# Patient Record
Sex: Male | Born: 1945 | Race: White | Hispanic: No | Marital: Single | Smoking: Former smoker
Health system: Southern US, Community
[De-identification: ages and names within clinical notes are randomized; demographics above are authoritative.]

## PROBLEM LIST (undated history)

## (undated) DIAGNOSIS — E785 Hyperlipidemia, unspecified: Secondary | ICD-10-CM

## (undated) DIAGNOSIS — C801 Malignant (primary) neoplasm, unspecified: Secondary | ICD-10-CM

## (undated) DIAGNOSIS — J449 Chronic obstructive pulmonary disease, unspecified: Secondary | ICD-10-CM

## (undated) DIAGNOSIS — K219 Gastro-esophageal reflux disease without esophagitis: Secondary | ICD-10-CM

## (undated) DIAGNOSIS — F0390 Unspecified dementia without behavioral disturbance: Secondary | ICD-10-CM

## (undated) DIAGNOSIS — F1021 Alcohol dependence, in remission: Secondary | ICD-10-CM

## (undated) DIAGNOSIS — I10 Essential (primary) hypertension: Secondary | ICD-10-CM

## (undated) HISTORY — PX: INSERTION PROSTATE RADIATION SEED: SUR718

## (undated) HISTORY — DX: Chronic obstructive pulmonary disease, unspecified: J44.9

## (undated) HISTORY — DX: Alcohol dependence, in remission: F10.21

## (undated) HISTORY — DX: Essential (primary) hypertension: I10

## (undated) HISTORY — DX: Hyperlipidemia, unspecified: E78.5

## (undated) HISTORY — DX: Unspecified dementia, unspecified severity, without behavioral disturbance, psychotic disturbance, mood disturbance, and anxiety: F03.90

## (undated) HISTORY — DX: Gastro-esophageal reflux disease without esophagitis: K21.9

## (undated) HISTORY — DX: Malignant (primary) neoplasm, unspecified: C80.1

---

## 2019-10-06 LAB — VITAMIN B12: Vitamin B-12: 986

## 2019-10-06 LAB — TSH: TSH: 1.67 (ref <=5.90)

## 2019-11-17 LAB — LIPID PANEL
Cholesterol: 225 — AB (ref 0–200)
HDL: 69 (ref 35–70)
LDL Cholesterol: 138
Triglycerides: 102 (ref 40–160)

## 2019-11-17 LAB — CBC AND DIFFERENTIAL
HCT: 44 (ref 41–53)
Hemoglobin: 15.6 (ref 13.5–17.5)
WBC: 4.9

## 2019-11-17 LAB — CBC: RBC: 4.16 (ref 3.87–5.11)

## 2019-11-23 LAB — BASIC METABOLIC PANEL
BUN: 18 (ref 4–21)
Creatinine: 1.2 (ref <=1.3)
Glucose: 123
Potassium: 4.4 (ref 3.4–5.3)
Sodium: 138 (ref 137–147)

## 2020-04-04 ENCOUNTER — Encounter: Payer: Self-pay | Admitting: *Deleted

## 2020-04-05 ENCOUNTER — Encounter: Payer: Self-pay | Admitting: Diagnostic Neuroimaging

## 2020-04-05 ENCOUNTER — Other Ambulatory Visit: Payer: Self-pay

## 2020-04-05 ENCOUNTER — Ambulatory Visit (INDEPENDENT_AMBULATORY_CARE_PROVIDER_SITE_OTHER): Payer: Medicare HMO | Admitting: Diagnostic Neuroimaging

## 2020-04-05 VITALS — BP 111/66 | HR 54 | Ht 68.0 in | Wt 144.8 lb

## 2020-04-05 DIAGNOSIS — F0390 Unspecified dementia without behavioral disturbance: Secondary | ICD-10-CM | POA: Diagnosis not present

## 2020-04-05 DIAGNOSIS — F03A0 Unspecified dementia, mild, without behavioral disturbance, psychotic disturbance, mood disturbance, and anxiety: Secondary | ICD-10-CM

## 2020-04-05 NOTE — Progress Notes (Signed)
[facility]  PATIENT: Carl Obrien DOB: [birth year]  REFERRING CLINICIAN: Monico Blitz, NP HISTORY FROM: patient  REASON FOR VISIT: new consult    HISTORICAL  CHIEF COMPLAINT:  Chief Complaint  Patient presents with  . Dementia    rm 7 New Pt, brother/HC POA -Bill, sis-in-law- Anne   MMSE 23    HISTORY OF PRESENT ILLNESS:   74 year old male here for evaluation of dementia.  Patient was living independently in [facility], with some history of alcohol abuse.  His brother and sister-in-law are living in [place].  During COVID-19 pandemic they did not have much contact.  However in October 2020 patient did get lost trying to find his way home was found by the police.  In February 2020 when he pressed his emergency alert, was found walking his dog, confused and admitted to the hospital.  He was then diagnosed with dementia and delirium.  Unclear whether he had gone through alcohol withdrawal or some other cause for his delirium.  He was discharged to memory care unit at [facility] place.  He has been there since that time. Patient now on Aricept for dementia.   REVIEW OF SYSTEMS: Full 14 system review of systems performed and negative with exception of: as per HPI.  ALLERGIES: No Known Allergies  HOME MEDICATIONS: Outpatient Medications Prior to Visit  Medication Sig Dispense Refill  . albuterol (VENTOLIN HFA) 108 (90 Base) MCG/ACT inhaler Inhale into the lungs every 6 (six) hours as needed.    Marland Kitchen allopurinol (ZYLOPRIM) 100 MG tablet Take 100 mg by mouth daily.    Marland Kitchen aspirin EC 81 MG tablet Take 81 mg by mouth daily.    Marland Kitchen atorvastatin (LIPITOR) 40 MG tablet Take 40 mg by mouth daily.    . cloNIDine (CATAPRES) 0.1 MG tablet Take 0.1 mg by mouth. Prn hyertension    . donepezil (ARICEPT) 5 MG tablet Take 5 mg by mouth at bedtime.    . hydrochlorothiazide (HYDRODIURIL) 25 MG tablet Take 25 mg by mouth daily.    . Loratadine 10 MG CAPS Take by mouth  daily.    . meloxicam (MOBIC) 7.5 MG tablet Take 7.5 mg by mouth 2 (two) times daily. As needed    . montelukast (SINGULAIR) 10 MG tablet Take 10 mg by mouth as needed.    . pantoprazole (PROTONIX) 40 MG tablet Take 40 mg by mouth daily.    Marland Kitchen thiamine 100 MG tablet Take 100 mg by mouth daily.    . timolol (TIMOPTIC) 0.5 % ophthalmic solution     . zolpidem (AMBIEN) 5 MG tablet Take 5 mg by mouth at bedtime as needed.     No facility-administered medications prior to visit.    PAST MEDICAL HISTORY: Past Medical History:  Diagnosis Date  . Cancer [facility])    prostate, s/p seed implant  . COPD (chronic obstructive pulmonary disease) ([place])   . Dementia ([place])   . GERD (gastroesophageal reflux disease)   . History of alcoholism ([place])   . Hyperlipidemia   . Hypertension     PAST SURGICAL HISTORY: History reviewed. No pertinent surgical history.  FAMILY HISTORY: Family History  Problem Relation Age of Onset  . Liver cancer Mother     SOCIAL HISTORY: Social History   Socioeconomic History  . Marital status: Single    Spouse name: Not on file  . Number of children: 0  . Years of education: Not on file  . Highest education level: Master's degree (e.g.,  MA, MS, MEng, MEd, MSW, MBA)  Occupational History    Comment: retired  Tobacco Use  . Smoking status: Former Research scientist (life sciences)  . Smokeless tobacco: Never Used  . Tobacco comment: years ago  Substance and Sexual Activity  . Alcohol use: Not Currently    Comment: quit Feb 2021  . Drug use: Never  . Sexual activity: Not on file  Other Topics Concern  . Not on file  Social History Narrative   04/04/20 since 01/21/20 lives at [place] , memory care    Social Determinants of Health   Financial Resource Strain:   . Difficulty of Paying Living Expenses:   Food Insecurity:   . Worried About Charity fundraiser in the Last Year:   . Arboriculturist in the Last Year:   Transportation Needs:   . Film/video editor  (Medical):   Marland Kitchen Lack of Transportation (Non-Medical):   Physical Activity:   . Days of Exercise per Week:   . Minutes of Exercise per Session:   Stress:   . Feeling of Stress :   Social Connections:   . Frequency of Communication with Friends and Family:   . Frequency of Social Gatherings with Friends and Family:   . Attends Religious Services:   . Active Member of Clubs or Organizations:   . Attends Archivist Meetings:   Marland Kitchen Marital Status:   Intimate Partner Violence:   . Fear of Current or Ex-Partner:   . Emotionally Abused:   Marland Kitchen Physically Abused:   . Sexually Abused:      PHYSICAL EXAM  GENERAL EXAM/CONSTITUTIONAL: Vitals:  Vitals:   04/05/20 1125  BP: 111/66  Pulse: (!) 54  Weight: 144 lb 12.8 oz (65.7 kg)  Height: 5\' 8"  (1.727 m)     Body mass index is 22.02 kg/m. Wt Readings from Last 3 Encounters:  04/05/20 144 lb 12.8 oz (65.7 kg)     Patient is in no distress; well developed, nourished and groomed; neck is supple  CARDIOVASCULAR:  Examination of carotid arteries is normal; no carotid bruits  Regular rate and rhythm, no murmurs  Examination of peripheral vascular system by observation and palpation is normal  EYES:  Ophthalmoscopic exam of optic discs and posterior segments is normal; no papilledema or hemorrhages  No exam data present  MUSCULOSKELETAL:  Gait, strength, tone, movements noted in Neurologic exam below  NEUROLOGIC: MENTAL STATUS:  MMSE - [place] Exam 04/05/2020  Orientation to time 5  Orientation to Place 2  Registration 3  Attention/ Calculation 3  Recall 2  Language- name 2 objects 2  Language- repeat 1  Language- follow 3 step command 3  Language- read & follow direction 1  Write a sentence 1  Copy design 0  Total score 23    awake, alert, oriented to person, place and time  recent and remote memory intact  normal attention and concentration  language fluent, comprehension intact, naming  intact  fund of knowledge appropriate  CRANIAL NERVE:   2nd - no papilledema on fundoscopic exam  2nd, 3rd, 4th, 6th - pupils equal and reactive to light, visual fields full to confrontation, extraocular muscles intact, no nystagmus  5th - facial sensation symmetric  7th - facial strength symmetric  8th - hearing intact  9th - palate elevates symmetrically, uvula midline  11th - shoulder shrug symmetric  12th - tongue protrusion midline  MOTOR:   normal bulk and tone, full strength in  the BUE, BLE  SENSORY:   normal and symmetric to light touch, temperature, vibration  COORDINATION:   finger-nose-finger, fine finger movements normal  REFLEXES:   deep tendon reflexes present and symmetric  GAIT/STATION:   narrow based gait; USING CANE     DIAGNOSTIC DATA (LABS, IMAGING, TESTING) - I reviewed patient records, labs, notes, testing and imaging myself where available.  No results found for: WBC, HGB, HCT, MCV, PLT No results found for: NA, K, CL, CO2, GLUCOSE, BUN, CREATININE, CALCIUM, PROT, ALBUMIN, AST, ALT, ALKPHOS, BILITOT, GFRNONAA, GFRAA No results found for: CHOL, HDL, LDLCALC, LDLDIRECT, TRIG, CHOLHDL No results found for: HGBA1C No results found for: VITAMINB12 No results found for: TSH    01/03/20 MRI brain - no acute findings - left external capsule chronic lacunar infarct    ASSESSMENT AND PLAN  74 y.o. year old male here with:  Dx:  1. Mild dementia ([place])     PLAN:  MEMORY LOSS / CONFUSION (MMSE 23 out of 30; decline in ADLs not able to manage finances, shopping or driving; not able to live alone; currently living at memory care unit [facility]) - mild-moderate dementia (since 2019) - h/o alcohol abuse (abstinent since March 2021; now at memory care unit) - continue donepezil  Return for return to PCP.    Penni Bombard, MD [id number], [id number] AM Certified in Neurology, Neurophysiology and Neuroimaging  [facility] [address] [place], [place] [address] [phone]

## 2020-04-05 NOTE — Patient Instructions (Signed)
MEMORY LOSS / CONFUSION  - mild-moderate dementia (since 2019) - h/o alcohol abuse (abstinent since March 2021; now at memory care unit) - continue donepezil

## 2020-07-03 ENCOUNTER — Telehealth: Payer: Self-pay | Admitting: *Deleted

## 2020-07-03 NOTE — Telephone Encounter (Signed)
Received fax from [facility] re: surgical clearance. On MD's desk for review, signature of anti-platelet letter.

## 2020-07-05 NOTE — Telephone Encounter (Signed)
Surgical clearance paper and letter signed, faxed to [facility].

## 2020-07-19 ENCOUNTER — Other Ambulatory Visit: Payer: Self-pay

## 2020-07-19 ENCOUNTER — Encounter: Payer: Self-pay | Admitting: Physician Assistant

## 2020-07-19 ENCOUNTER — Ambulatory Visit (INDEPENDENT_AMBULATORY_CARE_PROVIDER_SITE_OTHER): Payer: Medicare HMO | Admitting: Physician Assistant

## 2020-07-19 VITALS — BP 120/70 | HR 63 | Temp 98.1°F | Resp 16 | Ht 67.5 in | Wt 147.0 lb

## 2020-07-19 DIAGNOSIS — Z01818 Encounter for other preprocedural examination: Secondary | ICD-10-CM | POA: Diagnosis not present

## 2020-07-19 LAB — CBC WITH DIFFERENTIAL/PLATELET
Basophils Absolute: 0 10*3/uL (ref 0.0–0.1)
Basophils Relative: 0.2 % (ref 0.0–3.0)
Eosinophils Absolute: 0.1 10*3/uL (ref 0.0–0.7)
Eosinophils Relative: 2.7 % (ref 0.0–5.0)
HCT: 35.7 % — ABNORMAL LOW (ref 39.0–52.0)
Hemoglobin: 12 g/dL — ABNORMAL LOW (ref 13.0–17.0)
Lymphocytes Relative: 27.7 % (ref 12.0–46.0)
Lymphs Abs: 1.4 10*3/uL (ref 0.7–4.0)
MCHC: 33.6 g/dL (ref 30.0–36.0)
MCV: 90.9 fl (ref 78.0–100.0)
Monocytes Absolute: 0.5 10*3/uL (ref 0.1–1.0)
Monocytes Relative: 10.6 % (ref 3.0–12.0)
Neutro Abs: 3.1 10*3/uL (ref 1.4–7.7)
Neutrophils Relative %: 58.8 % (ref 43.0–77.0)
Platelets: 206 10*3/uL (ref 150.0–400.0)
RBC: 3.92 Mil/uL — ABNORMAL LOW (ref 4.22–5.81)
RDW: 15.7 % — ABNORMAL HIGH (ref 11.5–15.5)
WBC: 5.2 10*3/uL (ref 4.0–10.5)

## 2020-07-19 LAB — COMPREHENSIVE METABOLIC PANEL
ALT: 15 U/L (ref 0–53)
AST: 13 U/L (ref 0–37)
Albumin: 4.3 g/dL (ref 3.5–5.2)
Alkaline Phosphatase: 81 U/L (ref 39–117)
BUN: 22 mg/dL (ref 6–23)
CO2: 26 mEq/L (ref 19–32)
Calcium: 9 mg/dL (ref 8.4–10.5)
Chloride: 103 mEq/L (ref 96–112)
Creatinine, Ser: 1.76 mg/dL — ABNORMAL HIGH (ref 0.40–1.50)
GFR: 37.99 mL/min — ABNORMAL LOW (ref >60.00)
Glucose, Bld: 94 mg/dL (ref 70–99)
Potassium: 4.1 mEq/L (ref 3.5–5.1)
Sodium: 138 mEq/L (ref 135–145)
Total Bilirubin: 0.5 mg/dL (ref 0.2–1.2)
Total Protein: 6.1 g/dL (ref 6.0–8.3)

## 2020-07-19 LAB — PROTIME-INR
INR: 1 ratio (ref 0.8–1.0)
Prothrombin Time: 10.8 s (ref 9.6–13.1)

## 2020-07-19 NOTE — Progress Notes (Signed)
Patient presents to clinic today to establish care. Patient is accompanied by his sister-in-law who is one of his caregivers.  Patient recently moved from [facility] where he was living alone to [facility], residing in [place] memory unit after being diagnosed with dementia.  Patient is maintained on Aricept 10 mg daily.  Patient also with history of hypertension hyperlipidemia, currently on a regimen of losartan 50 mg twice daily and hydrochlorothiazide 25 mg once daily.  Is also on a regimen of atorvastatin 40 mg and aspirin 81 mg daily.  Endorses taking all medications as directed.  Denies history of MI or CVA.  Denies any known history of stress testing. Patient denies chest pain, palpitations, lightheadedness, dizziness, vision changes or frequent headaches.  The patient is  having a R hip arthroplasy with Dr. Berenice Primas.  This was initially scheduled for 07/28/2020 but now has been moved to a to be determined date due to Covid.  Is needing surgical clearance.  Has already been granted clearance by his neurologist, Dr. Leta Baptist.  Health Maintenance: Immunizations --records requested Colonoscopy --records requested  Past Medical History:  Diagnosis Date  . Cancer [facility])    prostate, s/p seed implant  . COPD (chronic obstructive pulmonary disease) ([place])   . Dementia ([place])   . GERD (gastroesophageal reflux disease)   . History of alcoholism ([place])   . Hyperlipidemia   . Hypertension    No past surgical history on file.  Current Outpatient Medications on File Prior to Visit  Medication Sig Dispense Refill  . albuterol (VENTOLIN HFA) 108 (90 Base) MCG/ACT inhaler Inhale into the lungs every 6 (six) hours as needed.    Marland Kitchen allopurinol (ZYLOPRIM) 100 MG tablet Take 100 mg by mouth daily.    Marland Kitchen aspirin EC 81 MG tablet Take 81 mg by mouth daily.    Marland Kitchen atorvastatin (LIPITOR) 40 MG tablet Take 40 mg by mouth daily.    . cloNIDine (CATAPRES) 0.1 MG tablet Take 0.1 mg by  mouth. Prn hyertension    . donepezil (ARICEPT) 5 MG tablet Take 5 mg by mouth at bedtime.    . fluticasone (FLONASE) 50 MCG/ACT nasal spray 2 (two) times daily.    . hydrochlorothiazide (HYDRODIURIL) 25 MG tablet Take 25 mg by mouth daily.    Marland Kitchen latanoprost (XALATAN) 0.005 % ophthalmic solution     . Loratadine 10 MG CAPS Take by mouth daily.    Marland Kitchen losartan (COZAAR) 50 MG tablet Take 50 mg by mouth 2 (two) times daily.    . meloxicam (MOBIC) 7.5 MG tablet Take 7.5 mg by mouth 2 (two) times daily. As needed    . montelukast (SINGULAIR) 10 MG tablet Take 10 mg by mouth as needed.    . pantoprazole (PROTONIX) 40 MG tablet Take 40 mg by mouth daily.    Marland Kitchen thiamine 100 MG tablet Take 100 mg by mouth daily.    . vitamin B-12 (CYANOCOBALAMIN) 1000 MCG tablet Take 1,000 mcg by mouth daily.    Marland Kitchen zolpidem (AMBIEN) 5 MG tablet Take 5 mg by mouth at bedtime as needed.     No current facility-administered medications on file prior to visit.    No Known Allergies  Family History  Problem Relation Age of Onset  . Liver cancer Mother     Social History   Socioeconomic History  . Marital status: Single    Spouse name: Not on file  . Number of children: 0  . Years of education:  Not on file  . Highest education level: Master's degree (e.g., MA, MS, MEng, MEd, MSW, MBA)  Occupational History    Comment: retired  Tobacco Use  . Smoking status: Former Research scientist (life sciences)  . Smokeless tobacco: Never Used  . Tobacco comment: years ago  Substance and Sexual Activity  . Alcohol use: Not Currently    Comment: quit Feb 2021  . Drug use: Never  . Sexual activity: Not on file  Other Topics Concern  . Not on file  Social History Narrative   04/04/20 since 01/21/20 lives at [place] , memory care    Social Determinants of Health   Financial Resource Strain:   . Difficulty of Paying Living Expenses: Not on file  Food Insecurity:   . Worried About Charity fundraiser in the Last Year: Not on file    . Ran Out of Food in the Last Year: Not on file  Transportation Needs:   . Lack of Transportation (Medical): Not on file  . Lack of Transportation (Non-Medical): Not on file  Physical Activity:   . Days of Exercise per Week: Not on file  . Minutes of Exercise per Session: Not on file  Stress:   . Feeling of Stress : Not on file  Social Connections:   . Frequency of Communication with Friends and Family: Not on file  . Frequency of Social Gatherings with Friends and Family: Not on file  . Attends Religious Services: Not on file  . Active Member of Clubs or Organizations: Not on file  . Attends Archivist Meetings: Not on file  . Marital Status: Not on file  Intimate Partner Violence:   . Fear of Current or Ex-Partner: Not on file  . Emotionally Abused: Not on file  . Physically Abused: Not on file  . Sexually Abused: Not on file   ROS Pertinent ROS are listed in the HPI.   Ht 5' 7.5" (1.715 m)   Wt 147 lb (66.7 kg)   BMI 22.68 kg/m   Physical Exam Vitals reviewed.  Constitutional:      Appearance: Normal appearance.  HENT:     Head: Normocephalic and atraumatic.     Right Ear: Tympanic membrane normal.     Left Ear: Tympanic membrane normal.     Nose: Nose normal.     Mouth/Throat:     Mouth: Mucous membranes are moist.  Eyes:     Conjunctiva/sclera: Conjunctivae normal.     Pupils: Pupils are equal, round, and reactive to light.  Cardiovascular:     Rate and Rhythm: Normal rate and regular rhythm.     Pulses: Normal pulses.     Heart sounds: Normal heart sounds.  Pulmonary:     Effort: Pulmonary effort is normal.     Breath sounds: Normal breath sounds.  Musculoskeletal:     Cervical back: Neck supple.  Neurological:     General: No focal deficit present.     Mental Status: He is alert. Mental status is at baseline.  Psychiatric:        Mood and Affect: Mood normal.     Assessment/Plan: 1. Preoperative clearance Blood pressure stable today.   Examination unremarkable.  No history of intolerance to anesthesia.  Is already received neurological clearance.  EKG today with mild sinus bradycardia.  No concerning findings.  Will obtain chest x-ray, urine culture and lab panel.  If all looks good will clear for surgery. - Urine Culture - CBC w/Diff -  Comp Met (CMET) - Protime-INR ( [facility] ONLY) - EKG 12-Lead - DG Chest 2 View; Future  This visit occurred during the SARS-CoV-2 public health emergency.  Safety protocols were in place, including screening questions prior to the visit, additional usage of staff PPE, and extensive cleaning of exam room while observing appropriate contact time as indicated for disinfecting solutions.     Leeanne Rio, PA-C

## 2020-07-19 NOTE — Patient Instructions (Signed)
Please go to the lab today for blood work.  I will call you with your results. We will alter treatment regimen(s) if indicated by your results.   Please got to the [place] office at [facility] for x-ray. They are open M-F 8-5  If labs and x-ray look good we can clear you for surgery. I will call you as soon as I have these results.

## 2020-07-20 ENCOUNTER — Ambulatory Visit (INDEPENDENT_AMBULATORY_CARE_PROVIDER_SITE_OTHER)
Admission: RE | Admit: 2020-07-20 | Discharge: 2020-07-20 | Disposition: A | Discharge status: Home / Self Care | Payer: Medicare HMO | Source: Ambulatory Visit | Attending: Physician Assistant | Admitting: Physician Assistant

## 2020-07-20 DIAGNOSIS — Z01818 Encounter for other preprocedural examination: Secondary | ICD-10-CM

## 2020-07-21 LAB — URINE CULTURE
MICRO NUMBER:: 10899494
Result:: NO GROWTH
SPECIMEN QUALITY:: ADEQUATE

## 2020-07-25 ENCOUNTER — Ambulatory Visit: Payer: Medicare HMO | Admitting: Physician Assistant

## 2020-07-28 ENCOUNTER — Encounter: Payer: Self-pay | Admitting: Physician Assistant

## 2020-08-16 ENCOUNTER — Ambulatory Visit (INDEPENDENT_AMBULATORY_CARE_PROVIDER_SITE_OTHER): Payer: Medicare HMO | Admitting: Dermatology

## 2020-08-16 ENCOUNTER — Other Ambulatory Visit: Payer: Self-pay

## 2020-08-16 DIAGNOSIS — L400 Psoriasis vulgaris: Secondary | ICD-10-CM

## 2020-08-16 MED ORDER — TRIAMCINOLONE ACETONIDE 0.1 % EX CREA: 1.0000 "application " | TOPICAL_CREAM | Freq: Every day | CUTANEOUS | 3 refills | Status: DC

## 2020-08-16 NOTE — Patient Instructions (Addendum)
First visit for Zadiel Leyh date of birth 06/07/1946.  For several years he has had a spreading scaly rash which has been called psoriasis and apparently he has been treated with hydrocortisone cream with minor benefit.  He is scheduled to have a hip replaced but denies having visible inflammation in his fingers or hands or severe morning stiffness in these joints.  Examination showed medium plaques relatively thin largest over the lumbosacral and the left front rib cage with smaller involvement of elbows and shin areas.  The nails are normal.  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 I explained the origin of this to Mr. Kanaan in some detail.  He was told there are 3 therapeutic options including multiple topicals, ultraviolet light, and systemic medications.  These help most people but unfortunately do not represent a true cure.  Most people their psoriasis is unrelated to their diet or their soap or anything that they are doing.  This is not contagious.  Initially he will apply 0.1% triamcinolone cream daily immediately after bathing to all areas of involvement.  If there is enough improvement in 4 to 6 weeks, he may choose to cancel the follow-up visit.  Should there be any questions that arise before that time, he or the care providers where he resides may contact us anytime.

## 2020-08-29 ENCOUNTER — Encounter: Payer: Self-pay | Admitting: Emergency Medicine

## 2020-09-08 ENCOUNTER — Other Ambulatory Visit: Payer: Self-pay | Admitting: Orthopedic Surgery

## 2020-09-12 ENCOUNTER — Encounter (HOSPITAL_COMMUNITY): Payer: Self-pay

## 2020-09-12 NOTE — Progress Notes (Addendum)
Preop instructions for:    Carl Obrien                      Date of Birth   Apr 15, 1946                          Date of Procedure:  09/22/2020        Doctor: DR Dorna Leitz  Time to arrive at [facility]:  0930am Report to: Admitting  Procedure:   Right Total Hip Arthroplasty   Ensure preop drink and hibiclens will be brought to [facility] by Loletta Specter ( poa, sister in law)  Do not eat   past midnight the night before your procedure.(To include any tube feedings-must be discontinued) May have clear liquids from 65midnite until 0930am morning of surgery then nothing by mouth.  Please complete Ensure preop drink ordered by surgeon by 0930am morning of surgery.      CLEAR LIQUID DIET   Foods Allowed                                                                      Coffee and tea, regular and decaf                             Plain Jell-O any favor except red or purple                                           Fruit ices (not with fruit pulp)                                      Iced Popsicles                                    Carbonated beverages, regular and diet                                    Cranberry, grape and apple juices Sports drinks like Gatorade Lightly seasoned clear broth or consume(fat free) Sugar, honey syrup                                                 _____________________________________________________________________     CLEAR LIQUID DIET     Coffee and tea, regular and decaf                              Plain Jell-O any favor except red or purple  Fruit ices (not with fruit pulp)                                      Iced Popsicles                                   Carbonated beverages, regular and diet                                    Cranberry, grape and apple juices Sports drinks like Gatorade Lightly seasoned clear broth or consume(fat free) Sugar, honey  syrup                                                  _____________________________________________________________________     Take these morning medications only with sips of water.(or give through gastrostomy or feeding tube).  Inhaler as usual and send with patient, Allopurino ( if takes in am ) l, flonase( if takes in am ) , claritin (  if takes in am )  , pantoprazole ( if takes in am )  ,  eye drops as usual     Facility contact:   [employer]         [phone]:                  [place]: Enlow and Amond Speranza ( brother and sister in law) [phone] Transportation contact phone#:  Please send day of procedure:current med list and meds last taken that day, confirm nothing by mouth status from what time, Patient Demographic info( to include DNR status, problem list, allergies)   RN contact name/phone#:                             and Fax #: [phone]  [place] card and picture ID Leave all jewelry and other valuables at place where living( no metal or rings to be worn) No contact lens  Men-no colognes,lotions, powders, deodorant  Any questions day of procedure,call  SHORT STAY-[phone]   Sent from :[facility]                   [place]                   Fax:[phone]  Sent by : Gillian Shields RN

## 2020-09-12 NOTE — Progress Notes (Signed)
[facility] - Preparing for Surgery Before surgery, you can play an important role.  Because skin is not sterile, your skin needs to be as free of germs as possible.  You can reduce the number of germs on your skin by washing with CHG (chlorahexidine gluconate) soap before surgery.  CHG is an antiseptic cleaner which kills germs and bonds with the skin to continue killing germs even after washing. Please DO NOT use if you have an allergy to CHG or antibacterial soaps.  If your skin becomes reddened/irritated stop using the CHG and inform your nurse when you arrive at Short Stay. Do not shave (including legs and underarms) for at least 48 hours prior to the first CHG shower.  You may shave your face/neck. Please follow these instructions carefully:  1.  Shower with CHG Soap the night before surgery and the  morning of Surgery.  2.  If you choose to wash your hair, wash your hair first as usual with your  normal  shampoo.  3.  After you shampoo, rinse your hair and body thoroughly to remove the  shampoo.                           4.  Use CHG as you would any other liquid soap.  You can apply chg directly  to the skin and wash                       Gently with a scrungie or clean washcloth.  5.  Apply the CHG Soap to your body ONLY FROM THE NECK DOWN.   Do not use on face/ open                           Wound or open sores. Avoid contact with eyes, ears mouth and genitals (private parts).                       Wash face,  Genitals (private parts) with your normal soap.             6.  Wash thoroughly, paying special attention to the area where your surgery  will be performed.  7.  Thoroughly rinse your body with warm water from the neck down.  8.  DO NOT shower/wash with your normal soap after using and rinsing off  the CHG Soap.                9.  Pat yourself dry with a clean towel.            10.  Wear clean pajamas.            11.  Place clean sheets on your bed the night of your first shower and  do not  sleep with pets. Day of Surgery : Do not apply any lotions/deodorants the morning of surgery.  Please wear clean clothes to the hospital/surgery center.  FAILURE TO FOLLOW THESE INSTRUCTIONS MAY RESULT IN THE CANCELLATION OF YOUR SURGERY PATIENT SIGNATURE_________________________________  NURSE SIGNATURE__________________________________  ________________________________________________________________________  

## 2020-09-13 ENCOUNTER — Encounter (HOSPITAL_COMMUNITY)
Admission: RE | Admit: 2020-09-13 | Discharge: 2020-09-13 | Disposition: A | Discharge status: Home / Self Care | Payer: Medicare HMO | Source: Ambulatory Visit | Attending: Physician Assistant | Admitting: Physician Assistant

## 2020-09-18 NOTE — Progress Notes (Signed)
         Anesthesia Review: Has Moderate Dementia  PCP: Resident at [facility]  PCP- Vergia Alcon , [place]  Cardiologist : Chest x-ray :07/21/20  EKG : 07/19/20   Echo : Stress test: Cardiac Cath :  Activity level:  Sleep Study/ CPAP :no Fasting Blood Sugar :      / Checks Blood Sugar -- times a day:   Blood Thinner/ Instructions /Last Dose: ASA / Instructions/ Last Dose :  Labs done at [facility] on 09/14/20 on chart  07/19/20- preop  Clearance Raiford Noble, PAc- epic

## 2020-09-21 MED ORDER — BUPIVACAINE LIPOSOME 1.3 % IJ SUSP
10.0000 mL | Freq: Once | INTRAMUSCULAR | Status: DC
  Filled 2020-09-21: qty 10

## 2020-09-21 NOTE — Progress Notes (Signed)
   New Patient   Subjective  Carl Obrien is a 74 y.o. male who presents for the following: Psoriasis (All over. Started on scalp and now its going down neck, ears, and eyelids. Also on chest. Tx hydrocortisone 2.5% oinment).  Psoriasis Location: scalp,neck,ears, chest, and eyelids. Duration:  Quality:  Associated Signs/Symptoms: scaly rash  Modifying Factors:  Severity:  Timing: Context:    The following portions of the chart were reviewed this encounter and updated as appropriate: Tobacco  Allergies  Meds  Problems  Med Hx  Surg Hx  Fam Hx      Objective  Well appearing patient in no apparent distress; mood and affect are within normal limits.  All skin waist up examined.    First visit for Carl Obrien date of birth 06/07/1946.  For several years he has had a spreading scaly rash which has been called psoriasis and apparently he has been treated with hydrocortisone cream with minor benefit.  He is scheduled to have a hip replaced but denies having visible inflammation in his fingers or hands or severe morning stiffness in these joints.  Examination showed medium plaques relatively thin largest over the lumbosacral and the left front rib cage with smaller involvement of elbows and shin areas.  The nails are normal.  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 I explained the origin of this to Mr. Carl Obrien in some detail.  He was told there are 3 therapeutic options including multiple topicals, ultraviolet light, and systemic medications.  These help most people but unfortunately do not represent a true cure.  Most people their psoriasis is unrelated to their diet or their soap or anything that they are doing.  This is not contagious.  Initially he will apply 0.1% triamcinolone cream daily immediately after bathing to all areas of involvement.  If there is enough improvement in 4 to 6 weeks, he may choose to cancel the follow-up visit.  Should there be any questions  that arise before that time, he or the care providers where he resides may contact us anytime.  Assessment & Plan  Plaque psoriasis (7) Left Elbow - Posterior; Right Elbow - Posterior; Neck - Posterior; Right Hip (side) - Posterior; Left Abdomen (side) - Lower; Left Lower Back; Mid Back  Went over multiple options for treatment, including light treatment, topicals or systemic therapies like biologic drugs. We will start with topical Triamcinolone after bathing and have follow up scheduled.   triamcinolone cream (KENALOG) 0.1 % - Left Abdomen (side) - Lower, Left Elbow - Posterior, Left Lower Back, Mid Back, Neck - Posterior, Right Elbow - Posterior, Right Hip (side) - Posterior

## 2020-09-22 ENCOUNTER — Ambulatory Visit (HOSPITAL_COMMUNITY): Payer: Medicare HMO | Admitting: Physician Assistant

## 2020-09-22 ENCOUNTER — Ambulatory Visit (HOSPITAL_COMMUNITY): Payer: Medicare HMO

## 2020-09-22 ENCOUNTER — Encounter (HOSPITAL_COMMUNITY)
Admission: AD | Disposition: A | Discharge status: Home Health | Payer: Self-pay | Source: Home / Self Care | Attending: Orthopedic Surgery

## 2020-09-22 ENCOUNTER — Other Ambulatory Visit: Payer: Self-pay

## 2020-09-22 ENCOUNTER — Encounter: Payer: Self-pay | Admitting: Dermatology

## 2020-09-22 ENCOUNTER — Encounter (HOSPITAL_COMMUNITY): Payer: Self-pay | Admitting: Orthopedic Surgery

## 2020-09-22 ENCOUNTER — Inpatient Hospital Stay (HOSPITAL_COMMUNITY)
Admission: AD | Admit: 2020-09-22 | Discharge: 2020-09-25 | DRG: 470 | Disposition: A | Discharge status: Home Health | Payer: Medicare HMO | Attending: Orthopedic Surgery | Admitting: Orthopedic Surgery

## 2020-09-22 DIAGNOSIS — M1611 Unilateral primary osteoarthritis, right hip: Principal | ICD-10-CM

## 2020-09-22 DIAGNOSIS — Z881 Allergy status to other antibiotic agents status: Secondary | ICD-10-CM

## 2020-09-22 DIAGNOSIS — S72041A Displaced fracture of base of neck of right femur, initial encounter for closed fracture: Secondary | ICD-10-CM

## 2020-09-22 DIAGNOSIS — Z87891 Personal history of nicotine dependence: Secondary | ICD-10-CM | POA: Diagnosis not present

## 2020-09-22 DIAGNOSIS — Z8546 Personal history of malignant neoplasm of prostate: Secondary | ICD-10-CM | POA: Diagnosis not present

## 2020-09-22 DIAGNOSIS — Z923 Personal history of irradiation: Secondary | ICD-10-CM | POA: Diagnosis not present

## 2020-09-22 DIAGNOSIS — J449 Chronic obstructive pulmonary disease, unspecified: Secondary | ICD-10-CM | POA: Diagnosis present

## 2020-09-22 DIAGNOSIS — Z79899 Other long term (current) drug therapy: Secondary | ICD-10-CM | POA: Diagnosis not present

## 2020-09-22 DIAGNOSIS — F0390 Unspecified dementia without behavioral disturbance: Secondary | ICD-10-CM | POA: Diagnosis present

## 2020-09-22 DIAGNOSIS — Z96641 Presence of right artificial hip joint: Secondary | ICD-10-CM

## 2020-09-22 DIAGNOSIS — K219 Gastro-esophageal reflux disease without esophagitis: Secondary | ICD-10-CM | POA: Diagnosis present

## 2020-09-22 DIAGNOSIS — Z800 Family history of malignant neoplasm of digestive organs: Secondary | ICD-10-CM

## 2020-09-22 DIAGNOSIS — I10 Essential (primary) hypertension: Secondary | ICD-10-CM | POA: Diagnosis present

## 2020-09-22 DIAGNOSIS — Z20822 Contact with and (suspected) exposure to covid-19: Secondary | ICD-10-CM | POA: Diagnosis present

## 2020-09-22 DIAGNOSIS — F1020 Alcohol dependence, uncomplicated: Secondary | ICD-10-CM | POA: Diagnosis present

## 2020-09-22 DIAGNOSIS — D62 Acute posthemorrhagic anemia: Secondary | ICD-10-CM | POA: Diagnosis not present

## 2020-09-22 DIAGNOSIS — Z01811 Encounter for preprocedural respiratory examination: Secondary | ICD-10-CM

## 2020-09-22 HISTORY — PX: TOTAL HIP ARTHROPLASTY: SHX124

## 2020-09-22 LAB — GLUCOSE, CAPILLARY
Glucose-Capillary: 50 mg/dL — ABNORMAL LOW (ref 70–99)
Glucose-Capillary: 130 mg/dL — ABNORMAL HIGH (ref 70–99)
Glucose-Capillary: 81 mg/dL (ref 70–99)

## 2020-09-22 LAB — CBC WITH DIFFERENTIAL/PLATELET
Abs Immature Granulocytes: 0.02 10*3/uL (ref 0.00–0.07)
Basophils Absolute: 0 10*3/uL (ref 0.0–0.1)
Basophils Relative: 0 %
Eosinophils Absolute: 0.2 10*3/uL (ref 0.0–0.5)
Eosinophils Relative: 2 %
HCT: 38.7 % — ABNORMAL LOW (ref 39.0–52.0)
Hemoglobin: 12.7 g/dL — ABNORMAL LOW (ref 13.0–17.0)
Immature Granulocytes: 0 %
Lymphocytes Relative: 21 %
Lymphs Abs: 1.6 10*3/uL (ref 0.7–4.0)
MCH: 30.4 pg (ref 26.0–34.0)
MCHC: 32.8 g/dL (ref 30.0–36.0)
MCV: 92.6 fL (ref 80.0–100.0)
Monocytes Absolute: 1 10*3/uL (ref 0.1–1.0)
Monocytes Relative: 12 %
Neutro Abs: 4.9 10*3/uL (ref 1.7–7.7)
Neutrophils Relative %: 65 %
Platelets: 270 10*3/uL (ref 150–400)
RBC: 4.18 MIL/uL — ABNORMAL LOW (ref 4.22–5.81)
RDW: 14.3 % (ref 11.5–15.5)
WBC: 7.7 10*3/uL (ref 4.0–10.5)
nRBC: 0 % (ref 0.0–0.2)

## 2020-09-22 LAB — COMPREHENSIVE METABOLIC PANEL
ALT: 16 U/L (ref 0–44)
AST: 19 U/L (ref 15–41)
Albumin: 4.1 g/dL (ref 3.5–5.0)
Alkaline Phosphatase: 91 U/L (ref 38–126)
Anion gap: 10 (ref 5–15)
BUN: 19 mg/dL (ref 8–23)
CO2: 25 mmol/L (ref 22–32)
Calcium: 9 mg/dL (ref 8.9–10.3)
Chloride: 103 mmol/L (ref 98–111)
Creatinine, Ser: 1.14 mg/dL (ref 0.61–1.24)
GFR, Estimated: >60 mL/min (ref >60)
Glucose, Bld: 50 mg/dL — ABNORMAL LOW (ref 70–99)
Potassium: 3.3 mmol/L — ABNORMAL LOW (ref 3.5–5.1)
Sodium: 138 mmol/L (ref 135–145)
Total Bilirubin: 0.5 mg/dL (ref 0.3–1.2)
Total Protein: 6.7 g/dL (ref 6.5–8.1)

## 2020-09-22 LAB — PROTIME-INR
INR: 1 (ref 0.8–1.2)
Prothrombin Time: 12.6 seconds (ref 11.4–15.2)

## 2020-09-22 LAB — TYPE AND SCREEN
ABO/RH(D): O POS
Antibody Screen: NEGATIVE

## 2020-09-22 LAB — ABO/RH: ABO/RH(D): O POS

## 2020-09-22 LAB — SURGICAL PCR SCREEN
MRSA, PCR: NEGATIVE
Staphylococcus aureus: NEGATIVE

## 2020-09-22 LAB — RESPIRATORY PANEL BY RT PCR (FLU A&B, COVID)
Influenza A by PCR: NEGATIVE
Influenza B by PCR: NEGATIVE
SARS Coronavirus 2 by RT PCR: NEGATIVE

## 2020-09-22 LAB — APTT: aPTT: 33 seconds (ref 24–36)

## 2020-09-22 SURGERY — ARTHROPLASTY, HIP, TOTAL, ANTERIOR APPROACH
Anesthesia: Spinal | Site: Hip | Laterality: Right

## 2020-09-22 MED ORDER — DEXTROSE 50 % IV SOLN
INTRAVENOUS | Status: AC
  Administered 2020-09-22: 25 g via INTRAVENOUS
  Filled 2020-09-22: qty 50

## 2020-09-22 MED ORDER — METOCLOPRAMIDE HCL 5 MG/ML IJ SOLN: 5.0000 mg | Freq: Three times a day (TID) | INTRAMUSCULAR | Status: DC | PRN

## 2020-09-22 MED ORDER — LACTATED RINGERS IV SOLN: INTRAVENOUS | Status: DC

## 2020-09-22 MED ORDER — PROPOFOL 500 MG/50ML IV EMUL
INTRAVENOUS | Status: DC | PRN
  Administered 2020-09-22: 50 ug/kg/min via INTRAVENOUS

## 2020-09-22 MED ORDER — DEXAMETHASONE SODIUM PHOSPHATE 10 MG/ML IJ SOLN
10.0000 mg | Freq: Two times a day (BID) | INTRAMUSCULAR | Status: AC
  Administered 2020-09-23 – 2020-09-24 (×3): 10 mg via INTRAVENOUS
  Filled 2020-09-22 (×3): qty 1

## 2020-09-22 MED ORDER — FENTANYL CITRATE (PF) 100 MCG/2ML IJ SOLN
INTRAMUSCULAR | Status: AC
  Filled 2020-09-22: qty 2

## 2020-09-22 MED ORDER — ORAL CARE MOUTH RINSE: 15.0000 mL | Freq: Once | OROMUCOSAL | Status: AC

## 2020-09-22 MED ORDER — MENTHOL 3 MG MT LOZG: 1.0000 | LOZENGE | OROMUCOSAL | Status: DC | PRN

## 2020-09-22 MED ORDER — DEXTROSE 50 % IV SOLN: 25.0000 g | INTRAVENOUS | Status: AC

## 2020-09-22 MED ORDER — PHENYLEPHRINE 40 MCG/ML (10ML) SYRINGE FOR IV PUSH (FOR BLOOD PRESSURE SUPPORT)
PREFILLED_SYRINGE | INTRAVENOUS | Status: AC
  Filled 2020-09-22: qty 10

## 2020-09-22 MED ORDER — BUPIVACAINE IN DEXTROSE 0.75-8.25 % IT SOLN
INTRATHECAL | Status: DC | PRN
  Administered 2020-09-22: 1.6 mL via INTRATHECAL

## 2020-09-22 MED ORDER — CHLORHEXIDINE GLUCONATE CLOTH 2 % EX PADS: 6.0000 | MEDICATED_PAD | Freq: Every day | CUTANEOUS | Status: DC

## 2020-09-22 MED ORDER — ONDANSETRON HCL 4 MG/2ML IJ SOLN: 4.0000 mg | Freq: Once | INTRAMUSCULAR | Status: DC | PRN

## 2020-09-22 MED ORDER — ACETAMINOPHEN 325 MG PO TABS
325.0000 mg | ORAL_TABLET | Freq: Four times a day (QID) | ORAL | Status: DC | PRN
  Administered 2020-09-22 – 2020-09-23 (×3): 650 mg via ORAL
  Filled 2020-09-22 (×3): qty 2

## 2020-09-22 MED ORDER — CEFAZOLIN SODIUM-DEXTROSE 2-4 GM/100ML-% IV SOLN
2.0000 g | INTRAVENOUS | Status: AC
  Administered 2020-09-22: 2 g via INTRAVENOUS
  Filled 2020-09-22: qty 100

## 2020-09-22 MED ORDER — SODIUM CHLORIDE 0.9 % IV SOLN: INTRAVENOUS | Status: DC

## 2020-09-22 MED ORDER — METHOCARBAMOL 500 MG IVPB - SIMPLE MED
500.0000 mg | Freq: Four times a day (QID) | INTRAVENOUS | Status: DC | PRN
  Filled 2020-09-22: qty 50

## 2020-09-22 MED ORDER — GLYCOPYRROLATE 0.2 MG/ML IJ SOLN
INTRAMUSCULAR | Status: DC | PRN
  Administered 2020-09-22: .2 mg via INTRAVENOUS

## 2020-09-22 MED ORDER — EPHEDRINE 5 MG/ML INJ
INTRAVENOUS | Status: AC
  Filled 2020-09-22: qty 10

## 2020-09-22 MED ORDER — ATROPINE SULFATE 0.4 MG/ML IJ SOLN
INTRAMUSCULAR | Status: DC | PRN
  Administered 2020-09-22: .2 mg via INTRAVENOUS

## 2020-09-22 MED ORDER — ASPIRIN EC 325 MG PO TBEC
325.0000 mg | DELAYED_RELEASE_TABLET | Freq: Two times a day (BID) | ORAL | Status: DC
  Administered 2020-09-23 – 2020-09-25 (×5): 325 mg via ORAL
  Filled 2020-09-22 (×5): qty 1

## 2020-09-22 MED ORDER — FENTANYL CITRATE (PF) 100 MCG/2ML IJ SOLN
INTRAMUSCULAR | Status: DC | PRN
  Administered 2020-09-22: 50 ug via INTRAVENOUS

## 2020-09-22 MED ORDER — OXYCODONE HCL 5 MG PO TABS: 10.0000 mg | ORAL_TABLET | ORAL | Status: DC | PRN

## 2020-09-22 MED ORDER — DIPHENHYDRAMINE HCL 12.5 MG/5ML PO ELIX: 12.5000 mg | ORAL_SOLUTION | ORAL | Status: DC | PRN

## 2020-09-22 MED ORDER — BISACODYL 5 MG PO TBEC: 5.0000 mg | DELAYED_RELEASE_TABLET | Freq: Every day | ORAL | Status: DC | PRN

## 2020-09-22 MED ORDER — METOCLOPRAMIDE HCL 5 MG PO TABS: 5.0000 mg | ORAL_TABLET | Freq: Three times a day (TID) | ORAL | Status: DC | PRN

## 2020-09-22 MED ORDER — FENTANYL CITRATE (PF) 100 MCG/2ML IJ SOLN: 25.0000 ug | INTRAMUSCULAR | Status: DC | PRN

## 2020-09-22 MED ORDER — CEFAZOLIN SODIUM-DEXTROSE 1-4 GM/50ML-% IV SOLN
1.0000 g | Freq: Once | INTRAVENOUS | Status: AC
  Administered 2020-09-22: 1 g via INTRAVENOUS
  Filled 2020-09-22: qty 50

## 2020-09-22 MED ORDER — POLYETHYLENE GLYCOL 3350 17 G PO PACK: 17.0000 g | PACK | Freq: Every day | ORAL | Status: DC | PRN

## 2020-09-22 MED ORDER — GLYCOPYRROLATE PF 0.2 MG/ML IJ SOSY
PREFILLED_SYRINGE | INTRAMUSCULAR | Status: AC
  Filled 2020-09-22: qty 1

## 2020-09-22 MED ORDER — PHENOL 1.4 % MT LIQD: 1.0000 | OROMUCOSAL | Status: DC | PRN

## 2020-09-22 MED ORDER — EPHEDRINE SULFATE-NACL 50-0.9 MG/10ML-% IV SOSY: PREFILLED_SYRINGE | INTRAVENOUS | Status: DC | PRN

## 2020-09-22 MED ORDER — DEXAMETHASONE SODIUM PHOSPHATE 10 MG/ML IJ SOLN
INTRAMUSCULAR | Status: DC | PRN
  Administered 2020-09-22: 5 mg via INTRAVENOUS

## 2020-09-22 MED ORDER — CHLORHEXIDINE GLUCONATE 0.12 % MT SOLN
15.0000 mL | Freq: Once | OROMUCOSAL | Status: AC
  Administered 2020-09-22: 15 mL via OROMUCOSAL

## 2020-09-22 MED ORDER — OXYCODONE HCL 5 MG PO TABS
5.0000 mg | ORAL_TABLET | ORAL | Status: DC | PRN
  Administered 2020-09-22: 10 mg via ORAL
  Administered 2020-09-22 (×2): 5 mg via ORAL
  Administered 2020-09-23: 10 mg via ORAL
  Administered 2020-09-23 (×3): 5 mg via ORAL
  Administered 2020-09-24 – 2020-09-25 (×3): 10 mg via ORAL
  Filled 2020-09-22: qty 2
  Filled 2020-09-22: qty 1
  Filled 2020-09-22 (×4): qty 2
  Filled 2020-09-22: qty 1
  Filled 2020-09-22 (×3): qty 2

## 2020-09-22 MED ORDER — BUPIVACAINE LIPOSOME 1.3 % IJ SUSP
INTRAMUSCULAR | Status: DC | PRN
  Administered 2020-09-22: 10 mL

## 2020-09-22 MED ORDER — ONDANSETRON HCL 4 MG/2ML IJ SOLN: 4.0000 mg | Freq: Four times a day (QID) | INTRAMUSCULAR | Status: DC | PRN

## 2020-09-22 MED ORDER — MAGNESIUM CITRATE PO SOLN: 1.0000 | Freq: Once | ORAL | Status: DC | PRN

## 2020-09-22 MED ORDER — 0.9 % SODIUM CHLORIDE (POUR BTL) OPTIME
TOPICAL | Status: DC | PRN
  Administered 2020-09-22: 1000 mL

## 2020-09-22 MED ORDER — ONDANSETRON HCL 4 MG PO TABS: 4.0000 mg | ORAL_TABLET | Freq: Four times a day (QID) | ORAL | Status: DC | PRN

## 2020-09-22 MED ORDER — DOCUSATE SODIUM 100 MG PO CAPS
100.0000 mg | ORAL_CAPSULE | Freq: Two times a day (BID) | ORAL | Status: DC
  Administered 2020-09-22 – 2020-09-25 (×5): 100 mg via ORAL
  Filled 2020-09-22 (×5): qty 1

## 2020-09-22 MED ORDER — BUPIVACAINE-EPINEPHRINE 0.5% -1:200000 IJ SOLN
INTRAMUSCULAR | Status: AC
  Filled 2020-09-22: qty 1

## 2020-09-22 MED ORDER — METHOCARBAMOL 500 MG PO TABS
500.0000 mg | ORAL_TABLET | Freq: Four times a day (QID) | ORAL | Status: DC | PRN
  Administered 2020-09-22 – 2020-09-24 (×6): 500 mg via ORAL
  Filled 2020-09-22 (×6): qty 1

## 2020-09-22 MED ORDER — ATROPINE SULFATE 1 MG/10ML IJ SOSY
PREFILLED_SYRINGE | INTRAMUSCULAR | Status: AC
  Filled 2020-09-22: qty 10

## 2020-09-22 MED ORDER — ALUM & MAG HYDROXIDE-SIMETH 200-200-20 MG/5ML PO SUSP: 30.0000 mL | ORAL | Status: DC | PRN

## 2020-09-22 MED ORDER — TRANEXAMIC ACID-NACL 1000-0.7 MG/100ML-% IV SOLN
1000.0000 mg | Freq: Once | INTRAVENOUS | Status: AC
  Administered 2020-09-22: 1000 mg via INTRAVENOUS
  Filled 2020-09-22: qty 100

## 2020-09-22 MED ORDER — HYDROMORPHONE HCL 1 MG/ML IJ SOLN: 0.5000 mg | INTRAMUSCULAR | Status: DC | PRN

## 2020-09-22 MED ORDER — ACETAMINOPHEN 500 MG PO TABS
1000.0000 mg | ORAL_TABLET | Freq: Once | ORAL | Status: AC
  Administered 2020-09-22: 1000 mg via ORAL
  Filled 2020-09-22: qty 2

## 2020-09-22 MED ORDER — EPHEDRINE SULFATE-NACL 50-0.9 MG/10ML-% IV SOSY
PREFILLED_SYRINGE | INTRAVENOUS | Status: DC | PRN
  Administered 2020-09-22: 5 mg via INTRAVENOUS
  Administered 2020-09-22: 10 mg via INTRAVENOUS

## 2020-09-22 MED ORDER — BUPIVACAINE-EPINEPHRINE 0.5% -1:200000 IJ SOLN
INTRAMUSCULAR | Status: DC | PRN
  Administered 2020-09-22: 30 mL

## 2020-09-22 MED ORDER — PROPOFOL 500 MG/50ML IV EMUL
INTRAVENOUS | Status: AC
  Filled 2020-09-22: qty 50

## 2020-09-22 MED ORDER — PHENYLEPHRINE 40 MCG/ML (10ML) SYRINGE FOR IV PUSH (FOR BLOOD PRESSURE SUPPORT)
PREFILLED_SYRINGE | INTRAVENOUS | Status: DC | PRN
  Administered 2020-09-22: 80 ug via INTRAVENOUS
  Administered 2020-09-22: 40 ug via INTRAVENOUS
  Administered 2020-09-22 (×2): 20 ug via INTRAVENOUS
  Administered 2020-09-22 (×2): 40 ug via INTRAVENOUS
  Administered 2020-09-22 (×2): 20 ug via INTRAVENOUS
  Administered 2020-09-22 (×2): 40 ug via INTRAVENOUS

## 2020-09-22 MED ORDER — POVIDONE-IODINE 10 % EX SWAB
2.0000 "application " | Freq: Once | CUTANEOUS | Status: AC
  Administered 2020-09-22: 2 via TOPICAL

## 2020-09-22 MED ORDER — WATER FOR IRRIGATION, STERILE IR SOLN
Status: DC | PRN
  Administered 2020-09-22: 2000 mL

## 2020-09-22 MED ORDER — TRANEXAMIC ACID-NACL 1000-0.7 MG/100ML-% IV SOLN
1000.0000 mg | INTRAVENOUS | Status: AC
  Administered 2020-09-22: 1000 mg via INTRAVENOUS
  Filled 2020-09-22: qty 100

## 2020-09-22 SURGICAL SUPPLY — 41 items
BAG ZIPLOCK 12X15 (MISCELLANEOUS) IMPLANT
BENZOIN TINCTURE PRP APPL 2/3 (GAUZE/BANDAGES/DRESSINGS) ×3 IMPLANT
BLADE SAW SGTL 18X1.27X75 (BLADE) ×2 IMPLANT
BLADE SAW SGTL 18X1.27X75MM (BLADE) ×1
BLADE SURG SZ10 CARB STEEL (BLADE) ×6 IMPLANT
CLOSURE STERI-STRIP 1/2X4 (GAUZE/BANDAGES/DRESSINGS) ×1
CLOSURE WOUND 1/2 X4 (GAUZE/BANDAGES/DRESSINGS)
CLSR STERI-STRIP ANTIMIC 1/2X4 (GAUZE/BANDAGES/DRESSINGS) ×2 IMPLANT
COVER PERINEAL POST (MISCELLANEOUS) ×3 IMPLANT
COVER SURGICAL LIGHT HANDLE (MISCELLANEOUS) ×3 IMPLANT
COVER WAND RF STERILE (DRAPES) IMPLANT
CUP ACETBLR 54 OD 100 SERIES (Hips) ×3 IMPLANT
DECANTER SPIKE VIAL GLASS SM (MISCELLANEOUS) ×3 IMPLANT
DRAPE STERI IOBAN 125X83 (DRAPES) ×3 IMPLANT
DRAPE U-SHAPE 47X51 STRL (DRAPES) ×6 IMPLANT
DRSG AQUACEL AG ADV 3.5X 6 (GAUZE/BANDAGES/DRESSINGS) ×3 IMPLANT
DURAPREP 26ML APPLICATOR (WOUND CARE) ×3 IMPLANT
ELECT BLADE TIP CTD 4 INCH (ELECTRODE) ×3 IMPLANT
ELECT REM PT RETURN 15FT ADLT (MISCELLANEOUS) ×3 IMPLANT
ELIMINATOR HOLE APEX DEPUY (Hips) ×3 IMPLANT
GAUZE XEROFORM 1X8 LF (GAUZE/BANDAGES/DRESSINGS) IMPLANT
GLOVE BIOGEL PI IND STRL 8 (GLOVE) ×2 IMPLANT
GLOVE BIOGEL PI INDICATOR 8 (GLOVE) ×4
GLOVE ECLIPSE 7.5 STRL STRAW (GLOVE) ×6 IMPLANT
GOWN STRL REUS W/TWL XL LVL3 (GOWN DISPOSABLE) ×6 IMPLANT
HEAD CERAMIC 36 PLUS5 (Hips) ×3 IMPLANT
HOLDER FOLEY CATH W/STRAP (MISCELLANEOUS) ×3 IMPLANT
HOOD PEEL AWAY FLYTE STAYCOOL (MISCELLANEOUS) ×6 IMPLANT
KIT TURNOVER KIT A (KITS) IMPLANT
LINER NEUTRAL 36ID 54OD (Liner) ×3 IMPLANT
NEEDLE HYPO 22GX1.5 SAFETY (NEEDLE) ×3 IMPLANT
PACK ANTERIOR HIP CUSTOM (KITS) ×3 IMPLANT
PENCIL SMOKE EVACUATOR (MISCELLANEOUS) IMPLANT
STAPLER VISISTAT 35W (STAPLE) IMPLANT
STEM CORAIL KA12 (Stem) ×3 IMPLANT
STRIP CLOSURE SKIN 1/2X4 (GAUZE/BANDAGES/DRESSINGS) IMPLANT
SUT ETHIBOND NAB CT1 #1 30IN (SUTURE) ×6 IMPLANT
SUT MNCRL AB 3-0 PS2 18 (SUTURE) IMPLANT
SUT VIC AB 0 CT1 36 (SUTURE) ×3 IMPLANT
SUT VIC AB 1 CT1 36 (SUTURE) ×3 IMPLANT
TRAY FOLEY MTR SLVR 16FR STAT (SET/KITS/TRAYS/PACK) ×3 IMPLANT

## 2020-09-22 NOTE — Transfer of Care (Signed)
Immediate Anesthesia Transfer of Care Note  Patient: Kobie Whidby  Procedure(s) Performed: TOTAL HIP ARTHROPLASTY ANTERIOR APPROACH (Right Hip)  Patient Location: PACU  Anesthesia Type:MAC and Spinal  Level of Consciousness: awake, alert  and oriented  Airway & Oxygen Therapy: Patient Spontanous Breathing  Post-op Assessment: Report given to RN and Post -op Vital signs reviewed and stable  Post vital signs: Reviewed and stable  Last Vitals:  Vitals Value Taken Time  BP 113/71 09/22/20 1204  Temp    Pulse 66 09/22/20 1208  Resp 14 09/22/20 1208  SpO2 100 % 09/22/20 1208  Vitals shown include unvalidated device data.  Last Pain:  Vitals:   09/22/20 0804  TempSrc:   PainSc: 0-No pain      Patients Stated Pain Goal: 4 (18/56/31 4970)  Complications: No complications documented.

## 2020-09-22 NOTE — H&P (Signed)
TOTAL HIP ADMISSION H&P  Patient is admitted for right total hip arthroplasty.  Subjective:  Chief Complaint: right hip pain  HPI: Carl Obrien, 74 y.o. male, has a history of pain and functional disability in the right hip(s) due to arthritis and patient has failed non-surgical conservative treatments for greater than 12 weeks to include NSAID's and/or analgesics, use of assistive devices and activity modification.  Onset of symptoms was gradual starting 3 years ago with rapidlly worsening course since that time.The patient noted no past surgery on the right hip(s).  Patient currently rates pain in the right hip at 9 out of 10 with activity. Patient has night pain, worsening of pain with activity and weight bearing, trendelenberg gait, pain that interfers with activities of daily living, pain with passive range of motion and joint swelling. 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 This condition presents safety issues increasing the risk of falls. This patient has had avascular necrosis of the hip, acetabular fracture, hip dysplasia.  There is no current active infection.  There are no problems to display for this patient.  Past Medical History:  Diagnosis Date  . Cancer [facility])    prostate, s/p seed implant  . COPD (chronic obstructive pulmonary disease) ([place])   . Dementia ([place])   . Dementia ([place])   . GERD (gastroesophageal reflux disease)   . History of alcoholism ([place])   . Hyperlipidemia   . Hypertension     Past Surgical History:  Procedure Laterality Date  . INSERTION PROSTATE RADIATION SEED      Current Facility-Administered Medications  Medication Dose Route Frequency Provider Last Rate Last Admin  . bupivacaine liposome (EXPAREL) 1.3 % injection 133 mg  10 mL Other Once Minda Ditto, RPH      . ceFAZolin (ANCEF) IVPB 2g/100 mL premix  2 g Intravenous On Call to OR Dorna Leitz, MD      . lactated  ringers infusion   Intravenous Continuous Catalina Gravel, MD 10 mL/hr at 09/22/20 0815 New Bag at 09/22/20 0815  . tranexamic acid (CYKLOKAPRON) IVPB 1,000 mg  1,000 mg Intravenous To OR Dorna Leitz, MD       Allergies  Allergen Reactions  . Ciprofloxacin Other (See Comments)    unknown    Social History   Tobacco Use  . Smoking status: Former Research scientist (life sciences)  . Smokeless tobacco: Never Used  . Tobacco comment: years ago  Substance Use Topics  . Alcohol use: Not Currently    Comment: quit Feb 2021    Family History  Problem Relation Age of Onset  . Liver cancer Mother      Review of Systems ROS: I have reviewed the patient's review of systems thoroughly and there are no positive responses as relates to the HPI. Objective:  Physical Exam  Vital signs in last 24 hours: Temp:  [98.8 F (37.1 C)] 98.8 F (37.1 C) (11/05 0745) Pulse Rate:  [54] 54 (11/05 0745) Resp:  [18] 18 (11/05 0745) BP: (132)/(76) 132/76 (11/05 0745) SpO2:  [93 %] 93 % (11/05 0745) Weight:  [64.1 kg] 64.1 kg (11/05 0745) Well-developed well-nourished patient in no acute distress. Alert and oriented x3 HEENT:within normal limits Cardiac: Regular rate and rhythm Pulmonary: Lungs clear to auscultation Abdomen: Soft and nontender.  Normal active bowel sounds  Musculoskeletal: R hip : painful rom limited IR NVI distally Labs: Recent Results (from the past 2160 hour(s))  CBC w/Diff  Status: Abnormal   Collection Time: 07/19/20  2:33 PM  Result Value Ref Range   WBC 5.2 4.0 - 10.5 K/uL   RBC 3.92 (L) 4.22 - 5.81 Mil/uL   Hemoglobin 12.0 (L) 13.0 - 17.0 g/dL   HCT 35.7 (L) 39 - 52 %   MCV 90.9 78.0 - 100.0 fl   MCHC 33.6 30.0 - 36.0 g/dL   RDW 15.7 (H) 11.5 - 15.5 %   Platelets 206.0 150 - 400 K/uL   Neutrophils Relative % 58.8 43 - 77 %   Lymphocytes Relative 27.7 12 - 46 %   Monocytes Relative 10.6 3 - 12 %   Eosinophils Relative 2.7 0 - 5 %   Basophils Relative 0.2 0 - 3 %   Neutro Abs 3.1  1.4 - 7.7 K/uL   Lymphs Abs 1.4 0.7 - 4.0 K/uL   Monocytes Absolute 0.5 0.1 - 1.0 K/uL   Eosinophils Absolute 0.1 0.0 - 0.7 K/uL   Basophils Absolute 0.0 0.0 - 0.1 K/uL  Comp Met (CMET)     Status: Abnormal   Collection Time: 07/19/20  2:33 PM  Result Value Ref Range   Sodium 138 135 - 145 mEq/L   Potassium 4.1 3.5 - 5.1 mEq/L   Chloride 103 96 - 112 mEq/L   CO2 26 19 - 32 mEq/L   Glucose, Bld 94 70 - 99 mg/dL   BUN 22 6 - 23 mg/dL   Creatinine, Ser 1.76 (H) 0.40 - 1.50 mg/dL   Total Bilirubin 0.5 0.2 - 1.2 mg/dL   Alkaline Phosphatase 81 39 - 117 U/L   AST 13 0 - 37 U/L   ALT 15 0 - 53 U/L   Total Protein 6.1 6.0 - 8.3 g/dL   Albumin 4.3 3.5 - 5.2 g/dL   GFR 37.99 (L) >60.00 mL/min   Calcium 9.0 8.4 - 10.5 mg/dL  Protime-INR ( [facility] ONLY)     Status: None   Collection Time: 07/19/20  2:33 PM  Result Value Ref Range   INR 1.0 0.8 - 1.0 ratio   Prothrombin Time 10.8 9.6 - 13.1 sec  Urine Culture     Status: None   Collection Time: 07/19/20  3:05 PM   Specimen: Urine  Result Value Ref Range   MICRO NUMBER: [id number]    SPECIMEN QUALITY: Adequate    Sample Source URINE    STATUS: FINAL    Result: No Growth   Respiratory Panel by RT PCR (Flu A&B, Covid) - Nasopharyngeal Swab     Status: None   Collection Time: 09/22/20  7:12 AM   Specimen: Nasopharyngeal Swab  Result Value Ref Range   SARS Coronavirus 2 by RT PCR NEGATIVE NEGATIVE    Comment: (NOTE) SARS-CoV-2 target nucleic acids are NOT DETECTED.  The SARS-CoV-2 RNA is generally detectable in upper respiratoy specimens during the acute phase of infection. The lowest concentration of SARS-CoV-2 viral copies this assay can detect is 131 copies/mL. A negative result does not preclude SARS-Cov-2 infection and should not be used as the sole basis for treatment or other patient management decisions. A negative result may occur with  improper specimen collection/handling, submission of specimen other than nasopharyngeal  swab, presence of viral mutation(s) within the areas targeted by this assay, and inadequate number of viral copies (<131 copies/mL). A negative result must be combined with clinical observations, patient history, and epidemiological information. The expected result is Negative.  Fact Sheet for Patients:  PinkCheek.be  Fact Sheet for Healthcare  Providers:  GravelBags.it  This test is no t yet approved or cleared by the [place] and  has been authorized for detection and/or diagnosis of SARS-CoV-2 by FDA under an Emergency Use Authorization (EUA). This EUA will remain  in effect (meaning this test can be used) for the duration of the COVID-19 declaration under Section 564(b)(1) of the Act, 21 U.S.C. section 360bbb-3(b)(1), unless the authorization is terminated or revoked sooner.     Influenza A by PCR NEGATIVE NEGATIVE   Influenza B by PCR NEGATIVE NEGATIVE    Comment: (NOTE) The Xpert Xpress SARS-CoV-2/FLU/RSV assay is intended as an aid in  the diagnosis of influenza from Nasopharyngeal swab specimens and  should not be used as a sole basis for treatment. Nasal washings and  aspirates are unacceptable for Xpert Xpress SARS-CoV-2/FLU/RSV  testing.  Fact Sheet for Patients: PinkCheek.be  Fact Sheet for Healthcare Providers: GravelBags.it  This test is not yet approved or cleared by the [place] FDA and  has been authorized for detection and/or diagnosis of SARS-CoV-2 by  FDA under an Emergency Use Authorization (EUA). This EUA will remain  in effect (meaning this test can be used) for the duration of the  Covid-19 declaration under Section 564(b)(1) of the Act, 21  U.S.C. section 360bbb-3(b)(1), unless the authorization is  terminated or revoked. Performed at [facility], [place] [address], [place], [place] [address]   APTT      Status: None   Collection Time: 09/22/20  8:00 AM  Result Value Ref Range   aPTT 33 24 - 36 seconds    Comment: Performed at [facility], [place] [address] [place], [place] [address]  CBC WITH DIFFERENTIAL     Status: Abnormal   Collection Time: 09/22/20  8:00 AM  Result Value Ref Range   WBC 7.7 4.0 - 10.5 K/uL   RBC 4.18 (L) 4.22 - 5.81 MIL/uL   Hemoglobin 12.7 (L) 13.0 - 17.0 g/dL   HCT 38.7 (L) 39 - 52 %   MCV 92.6 80.0 - 100.0 fL   MCH 30.4 26.0 - 34.0 pg   MCHC 32.8 30.0 - 36.0 g/dL   RDW 14.3 11.5 - 15.5 %   Platelets 270 150 - 400 K/uL   nRBC 0.0 0.0 - 0.2 %   Neutrophils Relative % 65 %   Neutro Abs 4.9 1.7 - 7.7 K/uL   Lymphocytes Relative 21 %   Lymphs Abs 1.6 0.7 - 4.0 K/uL   Monocytes Relative 12 %   Monocytes Absolute 1.0 0.1 - 1.0 K/uL   Eosinophils Relative 2 %   Eosinophils Absolute 0.2 0.0 - 0.5 K/uL   Basophils Relative 0 %   Basophils Absolute 0.0 0.0 - 0.1 K/uL   Immature Granulocytes 0 %   Abs Immature Granulocytes 0.02 0.00 - 0.07 K/uL    Comment: Performed at [facility], [place] [address] [place], [place] [address]  Comprehensive metabolic panel     Status: Abnormal   Collection Time: 09/22/20  8:00 AM  Result Value Ref Range   Sodium 138 135 - 145 mmol/L   Potassium 3.3 (L) 3.5 - 5.1 mmol/L   Chloride 103 98 - 111 mmol/L   CO2 25 22 - 32 mmol/L   Glucose, Bld 50 (L) 70 - 99 mg/dL    Comment: Glucose reference range applies only to samples taken after fasting for at least 8 hours.   BUN 19 8 - 23 mg/dL  Creatinine, Ser 1.14 0.61 - 1.24 mg/dL   Calcium 9.0 8.9 - 10.3 mg/dL   Total Protein 6.7 6.5 - 8.1 g/dL   Albumin 4.1 3.5 - 5.0 g/dL   AST 19 15 - 41 U/L   ALT 16 0 - 44 U/L   Alkaline Phosphatase 91 38 - 126 U/L   Total Bilirubin 0.5 0.3 - 1.2 mg/dL   GFR, Estimated >60 >60 mL/min    Comment: (NOTE) Calculated using the CKD-EPI Creatinine Equation (2021)    Anion gap 10 5 - 15    Comment: Performed at  [facility], [place] [address], [place], [place] [address]  Protime-INR     Status: None   Collection Time: 09/22/20  8:00 AM  Result Value Ref Range   Prothrombin Time 12.6 11.4 - 15.2 seconds   INR 1.0 0.8 - 1.2    Comment: (NOTE) INR goal varies based on device and disease states. Performed at [facility], [place] [address] [place], [place] [address]   Type and screen Order type and screen if day of surgery is less than 15 days from draw of preadmission visit or order morning of surgery if day of surgery is greater than 6 days from preadmission visit.     Status: None   Collection Time: 09/22/20  8:00 AM  Result Value Ref Range   ABO/RH(D) O POS    Antibody Screen NEG    Sample Expiration      09/25/2020,2359 Performed at [facility], [place] [address] [place], [place] [address]   ABO/Rh     Status: None   Collection Time: 09/22/20  8:32 AM  Result Value Ref Range   ABO/RH(D)      O POS Performed at [facility], [place] [address], [place], [place] [address]   Glucose, capillary     Status: Abnormal   Collection Time: 09/22/20  9:11 AM  Result Value Ref Range   Glucose-Capillary 50 (L) 70 - 99 mg/dL    Comment: Glucose reference range applies only to samples taken after fasting for at least 8 hours.    Estimated body mass index is 21.5 kg/m as calculated from the following:   Height as of this encounter: _0  (1.727 m).   Weight as of this encounter: 64.1 kg.   Imaging Review Plain radiographs demonstrate severe degenerative joint disease of the right hip(s). The bone quality appears to be fair for age and reported activity level.      Assessment/Plan:  End stage arthritis, right hip(s)  The patient history, physical examination, clinical judgement of the provider and imaging studies are consistent with end stage degenerative joint disease of the right hip(s) and total hip arthroplasty is  deemed medically necessary. The treatment options including medical management, injection therapy, arthroscopy and arthroplasty were discussed at length. The risks and benefits of total hip arthroplasty were presented and reviewed. The risks due to aseptic loosening, infection, stiffness, dislocation/subluxation,  thromboembolic complications and other imponderables were discussed.  The patient acknowledged the explanation, agreed to proceed with the plan and consent was signed. Patient is being admitted for inpatient treatment for surgery, pain control, PT, OT, prophylactic antibiotics, VTE prophylaxis, progressive ambulation and ADL's and discharge planning.The patient is planning to be discharged Skilled nursing vs return to [facility]

## 2020-09-22 NOTE — Discharge Instructions (Signed)

## 2020-09-22 NOTE — Progress Notes (Addendum)
Hypoglycemic Event  CBG: 50  Treatment: D50 50 mL (25 gm)  Symptoms: Pt states he felt "groggy" for a moment   Follow-up CBG: [id number] CBG Result: 130  Possible Reasons for Event: Other: Pt NPO prior to surgery  Comments/MD notified:Dr. Gifford Shave and Dr. Berenice Primas notified.    Rae Mar T

## 2020-09-22 NOTE — Anesthesia Preprocedure Evaluation (Addendum)
Anesthesia Evaluation  Patient identified by MRN, date of birth, ID band Patient awake    Reviewed: Allergy & Precautions, NPO status , Patient's Chart, lab work & pertinent test results  Airway Mallampati: II  TM Distance: >3 FB Neck ROM: Full    Dental  (+) Missing, Caps,    Pulmonary COPD, former smoker,    Pulmonary exam normal breath sounds clear to auscultation       Cardiovascular hypertension, Pt. on medications Normal cardiovascular exam Rhythm:Regular Rate:Normal     Neuro/Psych PSYCHIATRIC DISORDERS Dementia negative neurological ROS     GI/Hepatic GERD  Medicated,(+)     substance abuse  alcohol use,   Endo/Other  negative endocrine ROS  Renal/GU negative Renal ROS   Prostate cancer    Musculoskeletal  (+) Arthritis , SEVERE RIGHT HIP DEGENERATIVE JOINT DISEASE   Abdominal   Peds  Hematology  (+) Blood dyscrasia, anemia , Plt 270k   Anesthesia Other Findings Day of surgery medications reviewed with the patient.  Reproductive/Obstetrics                            Anesthesia Physical Anesthesia Plan  ASA: III  Anesthesia Plan: Spinal   Post-op Pain Management:    Induction: Intravenous  PONV Risk Score and Plan: 1 and Propofol infusion, Treatment may vary due to age or medical condition and Ondansetron  Airway Management Planned: Natural Airway and Nasal Cannula  Additional Equipment:   Intra-op Plan:   Post-operative Plan:   Informed Consent: I have reviewed the patients History and Physical, chart, labs and discussed the procedure including the risks, benefits and alternatives for the proposed anesthesia with the patient or authorized representative who has indicated his/her understanding and acceptance.       Plan Discussed with: CRNA, Anesthesiologist and Surgeon  Anesthesia Plan Comments:         Anesthesia Quick Evaluation

## 2020-09-22 NOTE — Anesthesia Postprocedure Evaluation (Signed)
Anesthesia Post Note  Patient: Carl Obrien  Procedure(s) Performed: TOTAL HIP ARTHROPLASTY ANTERIOR APPROACH (Right Hip)     Patient location during evaluation: PACU Anesthesia Type: Spinal Level of consciousness: oriented, awake and alert and awake Pain management: pain level controlled Vital Signs Assessment: post-procedure vital signs reviewed and stable Respiratory status: spontaneous breathing, respiratory function stable and patient connected to nasal cannula oxygen Cardiovascular status: blood pressure returned to baseline and stable Postop Assessment: no headache, no backache, no apparent nausea or vomiting and spinal receding Anesthetic complications: no   No complications documented.  Last Vitals:  Vitals:   09/22/20 1422 09/22/20 1553  BP: (!) 142/83 (!) 143/80  Pulse: (!) 55 (!) 56  Resp: 18 16  Temp: 36.4 C 36.9 C  SpO2: 100% 100%    Last Pain:  Vitals:   09/22/20 1553  TempSrc: Oral  PainSc:                  Catalina Gravel

## 2020-09-22 NOTE — Anesthesia Procedure Notes (Signed)
Spinal  Patient location during procedure: OR Start time: 09/22/2020 10:15 AM End time: 09/22/2020 10:19 AM Staffing Performed: resident/CRNA  Anesthesiologist: Catalina Gravel, MD Resident/CRNA: Gwyndolyn Saxon, CRNA Preanesthetic Checklist Completed: patient identified, IV checked, site marked, risks and benefits discussed, surgical consent, monitors and equipment checked, pre-op evaluation and timeout performed Spinal Block Patient position: sitting Prep: ChloraPrep Patient monitoring: heart rate, continuous pulse ox and blood pressure Approach: midline Location: L3-4 Injection technique: single-shot Needle Needle type: Pencan  Needle gauge: 22 G Needle length: 10 cm Assessment Sensory level: T6

## 2020-09-22 NOTE — Evaluation (Signed)
Occupational Therapy Evaluation Patient Details Name: Dakotah Orrego MRN: [id number] DOB: 1945-12-13 Today's Date: 09/22/2020    History of Present Illness Patient is a 74 year old man with medical history that includes COPD, dementia, OA, prostate cancer, GERD, history of alcoholism, hyperlidpidemia and HTN who is now s/p R THA.   Clinical Impression   Mr. Jamarr Treinen in a 74 year old man with mild dementia who is typically independent with BADLs and ambulation with RW in a memory care unit. On evaluation patient requiring min assist for bed transfers, min guard for ambulation and increased assistance for ADLs. Patient will benefit from skilled OT services while in hospital to improve deficits and learn compensatory strategies as needed in order to return to PLOF. Patient may be able to return to ALF with [facility] if he improves close to modified independence and facility able to provide level of care needed. If he doesn't improve he may require short term rehab at discharge for safety due to elevated fall risk.     Follow Up Recommendations  Home health OT;SNF;Supervision/Assistance - 24 hour    Equipment Recommendations  3 in 1 bedside commode    Recommendations for Other Services       Precautions / Restrictions Precautions Precautions: None Restrictions Weight Bearing Restrictions: No      Mobility Bed Mobility Overal bed mobility: Needs Assistance Bed Mobility: Supine to Sit     Supine to sit: Min assist;HOB elevated;+2 for safety/equipment          Transfers Overall transfer level: Needs assistance Equipment used: Rolling walker (2 wheeled) Transfers: Sit to/from [employer] Transfers Sit to Stand: From elevated surface;+2 safety/equipment;Min guard Stand pivot transfers: Min guard;+2 safety/equipment       General transfer comment: ambulated with RW.    Balance Overall balance assessment: Mild deficits observed, not formally tested                                          ADL either performed or assessed with clinical judgement   ADL Overall ADL's : Needs assistance/impaired Eating/Feeding: Independent   Grooming: Standing;Min guard   Upper Body Bathing: Sitting;Set up   Lower Body Bathing: Sit to/from stand;Minimal assistance   Upper Body Dressing : Set up;Sitting   Lower Body Dressing: Moderate assistance;Sit to/from stand   Toilet Transfer: Min guard;Ambulation;RW;Regular Toilet   Toileting- Clothing Manipulation and Hygiene: Minimal assistance;Sit to/from stand               Vision Patient Visual Report: No change from baseline       Perception     Praxis      Pertinent Vitals/Pain Pain Assessment: 0-10 Pain Score: 5  Pain Location: R Hip Pain Descriptors / Indicators: Grimacing Pain Intervention(s): Limited activity within patient's tolerance     Hand Dominance Right   Extremity/Trunk Assessment Upper Extremity Assessment Upper Extremity Assessment: Overall WFL for tasks assessed   Lower Extremity Assessment Lower Extremity Assessment: Defer to PT evaluation   Cervical / Trunk Assessment Cervical / Trunk Assessment: Normal   Communication Communication Communication: No difficulties   Cognition Arousal/Alertness: Awake/alert Behavior During Therapy: WFL for tasks assessed/performed Overall Cognitive Status: History of cognitive impairments - at baseline  General Comments: Mild dementia - mild forgetfulness and word finding difficulties.   General Comments       Exercises     Shoulder Instructions      Home Living Family/patient expects to be discharged to:: Other (Comment) (Independent Living - memory care facility) Living Arrangements: Alone                                      Prior Functioning/Environment Level of Independence: Independent with assistive device(s)        Comments: Has been using RW  due to pain. Independent with ADLs. Assistance with medication. ANd facility provides meals.        OT Problem List: Decreased strength;Decreased range of motion;Decreased activity tolerance;Impaired balance (sitting and/or standing);Decreased cognition;Decreased knowledge of use of DME or AE      OT Treatment/Interventions: Self-care/ADL training;DME and/or AE instruction;Therapeutic activities;Patient/family education;Balance training    OT Goals(Current goals can be found in the care plan section) Acute Rehab OT Goals Patient Stated Goal: To have less hip pain OT Goal Formulation: With patient Time For Goal Achievement: 10/06/20 Potential to Achieve Goals: Good  OT Frequency: Min 2X/week   Barriers to D/C:            Co-evaluation PT/OT/SLP Co-Evaluation/Treatment: Yes Reason for Co-Treatment: For patient/therapist safety;To address functional/ADL transfers PT goals addressed during session: Mobility/safety with mobility OT goals addressed during session: ADL's and self-care      AM-PAC OT "6 Clicks" Daily Activity     Outcome Measure Help from another person eating meals?: None Help from another person taking care of personal grooming?: A Little Help from another person toileting, which includes using toliet, bedpan, or urinal?: A Little Help from another person bathing (including washing, rinsing, drying)?: A Little Help from another person to put on and taking off regular upper body clothing?: A Little Help from another person to put on and taking off regular lower body clothing?: A Lot 6 Click Score: 18   End of Session Equipment Utilized During Treatment: Gait belt;Rolling walker Nurse Communication: Mobility status  Activity Tolerance: Patient tolerated treatment well Patient left: in chair;with call bell/phone within reach;with chair alarm set;with family/visitor present  OT Visit Diagnosis: Other abnormalities of gait and mobility (R26.89);Pain Pain -  Right/Left: Right Pain - part of body: Hip                Time: 6606-3016 OT Time Calculation (min): 27 min Charges:  OT General Charges $OT Visit: 1 Visit OT Evaluation $OT Eval Low Complexity: 1 Low  Alanys Godino, OTR/L [place] [phone] Pager: [place] 09/22/2020, 4:41 PM

## 2020-09-22 NOTE — Evaluation (Signed)
Physical Therapy Evaluation Patient Details Name: Carl Obrien MRN: [id number] DOB: [birth year] Today's Date: 09/22/2020   History of Present Illness  Patient is a 74 year old man with medical history that includes COPD, dementia, OA, prostate cancer, GERD, history of alcoholism, hyperlidpidemia and HTN who is now s/p R THA.  Clinical Impression  Pt admitted as above and presenting with functional mobility limitations 2* decreased R LE strength/pain, post op pain and dementia related memory deficits.  Pt hopes to progress to dc to IND living facility and may be able to return to ALF with [facility] if he improves close to modified independence and facility able to provide level of care needed. If he doesn't improve he may require short term rehab at discharge for safety due to elevated fall risk.    Follow Up Recommendations Home health PT;SNF    Equipment Recommendations  None recommended by PT    Recommendations for Other Services OT consult     Precautions / Restrictions Precautions Precautions: None Restrictions Weight Bearing Restrictions: No Other Position/Activity Restrictions: WBAT      Mobility  Bed Mobility Overal bed mobility: Needs Assistance Bed Mobility: Supine to Sit     Supine to sit: Min assist;HOB elevated;+2 for safety/equipment     General bed mobility comments: cues for sequence and use of L LE to self assist    Transfers Overall transfer level: Needs assistance Equipment used: Rolling walker (2 wheeled) Transfers: Sit to/from Stand Sit to Stand: From elevated surface;+2 safety/equipment;Min guard Stand pivot transfers: Min guard;+2 safety/equipment       General transfer comment: cues for LE management and use of UEs to self assist  Ambulation/Gait Ambulation/Gait assistance: Min assist Gait Distance (Feet): 125 Feet Assistive device: Rolling walker (2 wheeled) Gait Pattern/deviations: Step-to pattern;Step-through pattern;Decreased step length -  right;Decreased step length - left;Shuffle;Trunk flexed Gait velocity: decr   General Gait Details: cues for posture, position from RW and initial sequence  Stairs            Wheelchair Mobility    Modified Rankin (Stroke Patients Only)       Balance Overall balance assessment: Mild deficits observed, not formally tested                                           Pertinent Vitals/Pain Pain Assessment: 0-10 Pain Score: 5  Pain Location: R Hip Pain Descriptors / Indicators: Grimacing Pain Intervention(s): Limited activity within patient's tolerance;Monitored during session;Premedicated before session;Ice applied    Home Living Family/patient expects to be discharged to:: Other (Comment) Living Arrangements: Alone               Additional Comments: Independent living - memory care     Prior Function Level of Independence: Independent with assistive device(s)         Comments: Has been using RW due to pain. Independent with ADLs. Assistance with medication. ANd facility provides meals.     Hand Dominance   Dominant Hand: Right    Extremity/Trunk Assessment   Upper Extremity Assessment Upper Extremity Assessment: Overall WFL for tasks assessed    Lower Extremity Assessment Lower Extremity Assessment: RLE deficits/detail    Cervical / Trunk Assessment Cervical / Trunk Assessment: Normal  Communication   Communication: No difficulties  Cognition Arousal/Alertness: Awake/alert Behavior During Therapy: WFL for tasks assessed/performed Overall Cognitive Status: History of cognitive impairments - at  baseline                                 General Comments: Mild dementia - mild forgetfulness and word finding difficulties.      General Comments      Exercises Total Joint Exercises Ankle Circles/Pumps: AROM;Both;15 reps;Supine   Assessment/Plan    PT Assessment Patient needs continued PT services  PT Problem  List Decreased strength;Decreased activity tolerance;Decreased balance;Decreased mobility;Decreased range of motion;Decreased cognition;Decreased knowledge of use of DME;Pain       PT Treatment Interventions DME instruction;Gait training;Functional mobility training;Therapeutic activities;Therapeutic exercise;Patient/family education    PT Goals (Current goals can be found in the Care Plan section)  Acute Rehab PT Goals Patient Stated Goal: To have less hip pain PT Goal Formulation: With patient Time For Goal Achievement: 09/29/20 Potential to Achieve Goals: Good    Frequency 7X/week   Barriers to discharge Decreased caregiver support IND living - ? assist level available    Co-evaluation PT/OT/SLP Co-Evaluation/Treatment: Yes Reason for Co-Treatment: For patient/therapist safety;To address functional/ADL transfers PT goals addressed during session: Mobility/safety with mobility OT goals addressed during session: ADL's and self-care       AM-PAC PT "6 Clicks" Mobility  Outcome Measure Help needed turning from your back to your side while in a flat bed without using bedrails?: A Little Help needed moving from lying on your back to sitting on the side of a flat bed without using bedrails?: A Little Help needed moving to and from a bed to a chair (including a wheelchair)?: A Little Help needed standing up from a chair using your arms (e.g., wheelchair or bedside chair)?: A Little Help needed to walk in hospital room?: A Little Help needed climbing 3-5 steps with a railing? : A Little 6 Click Score: 18    End of Session Equipment Utilized During Treatment: Gait belt Activity Tolerance: Patient tolerated treatment well Patient left: in chair;with call bell/phone within reach;with family/visitor present Nurse Communication: Mobility status PT Visit Diagnosis: Difficulty in walking, not elsewhere classified (R26.2)    Time: 7116-5790 PT Time Calculation (min) (ACUTE ONLY): 26  min   Charges:   PT Evaluation $PT Eval Low Complexity: [place] Pager [phone] Office [phone]   Daquisha Clermont 09/22/2020, 5:24 PM

## 2020-09-22 NOTE — Plan of Care (Signed)

## 2020-09-23 LAB — CBC
HCT: 32.1 % — ABNORMAL LOW (ref 39.0–52.0)
Hemoglobin: 10.5 g/dL — ABNORMAL LOW (ref 13.0–17.0)
MCH: 30.3 pg (ref 26.0–34.0)
MCHC: 32.7 g/dL (ref 30.0–36.0)
MCV: 92.5 fL (ref 80.0–100.0)
Platelets: 201 10*3/uL (ref 150–400)
RBC: 3.47 MIL/uL — ABNORMAL LOW (ref 4.22–5.81)
RDW: 14 % (ref 11.5–15.5)
WBC: 10.7 10*3/uL — ABNORMAL HIGH (ref 4.0–10.5)
nRBC: 0 % (ref 0.0–0.2)

## 2020-09-23 LAB — URINE CULTURE: Culture: NO GROWTH

## 2020-09-23 NOTE — Progress Notes (Signed)
PATIENT ID: Carl Obrien  MRN: [id number]  DOB/AGE:  74-19-1947 / 74 y.o.  1 Day Post-Op Procedure(s) (LRB): TOTAL HIP ARTHROPLASTY ANTERIOR APPROACH (Right)    PROGRESS NOTE Subjective: Patient is alert, oriented, no Nausea, no Vomiting, yes passing gas, . Taking PO well. Denies SOB, Chest or Calf Pain. Using Incentive Spirometer, PAS in place. Ambulate WBAT with pt walking 125 ft Patient reports pain as moderate  .    Objective: Vital signs in last 24 hours: Vitals:   09/22/20 1748 09/22/20 2050 09/23/20 0140 09/23/20 0630  BP: 133/70 120/72 124/86 133/64  Pulse: (!) 58 (!) 51 (!) 55 63  Resp: 18 18 15 14   Temp: 97.7 F (36.5 C) 98.1 F (36.7 C) 98 F (36.7 C) 98.4 F (36.9 C)  TempSrc: Oral Oral Oral Oral  SpO2: 100% 100% 100% 100%  Weight:      Height:          Intake/Output from previous day: I/O last 3 completed shifts: In: 8786 [P.O.:960; I.V.:3250; IV Piggyback:250] Out: 3225 [Urine:2925; Blood:300]   Intake/Output this shift: Total I/O In: 240 [P.O.:240] Out: 50 [Urine:50]   LABORATORY DATA: Recent Labs    09/22/20 0800 09/22/20 0911 09/22/20 0931 09/22/20 1212 09/23/20 0426  WBC 7.7  --   --   --  10.7*  HGB 12.7*  --   --   --  10.5*  HCT 38.7*  --   --   --  32.1*  PLT 270  --   --   --  201  NA 138  --   --   --   --   K 3.3*  --   --   --   --   CL 103  --   --   --   --   CO2 25  --   --   --   --   BUN 19  --   --   --   --   CREATININE 1.14  --   --   --   --   GLUCOSE 50*  --   --   --   --   GLUCAP  --  50* 130* 81  --   INR 1.0  --   --   --   --   CALCIUM 9.0  --   --   --   --     Examination: Neurologically intact Neurovascular intact Sensation intact distally Intact pulses distally Dorsiflexion/Plantar flexion intact Incision: dressing C/D/I and no drainage No cellulitis present Compartment soft} XR AP&Lat of hip shows well placed\fixed THA  Assessment:   1 Day Post-Op Procedure(s) (LRB): TOTAL HIP ARTHROPLASTY  ANTERIOR APPROACH (Right) ADDITIONAL DIAGNOSIS:  Expected Acute Blood Loss Anemia, COPD, dementia, OA, prostate cancer, GERD, history of alcoholism,  Anticipated LOS equal to or greater than 2 midnights due to - Age 74 and older with one or more of the following:  - Obesity  - Expected need for hospital services (PT, OT, Nursing) required for safe  discharge  - Anticipated need for postoperative skilled nursing care or inpatient rehab  - Active co-morbidities: COPD, dementia, OA, prostate cancer, GERD, history of alcoholism,    Plan: PT/OT WBAT, THA  DVT Prophylaxis: SCDx72 hrs, ASA 325 mg BID x 2 weeks  DISCHARGE PLAN: Skilled Nursing Facility/Rehab  DISCHARGE NEEDS: HHPT, Walker and 3-in-1 comode seat

## 2020-09-23 NOTE — Progress Notes (Signed)
Physical Therapy Treatment Patient Details Name: Carl Obrien MRN: [id number] DOB: 1946/06/30 Today's Date: 09/23/2020    History of Present Illness Patient is a 74 year old man with medical history that includes COPD, dementia, OA, prostate cancer, GERD, history of alcoholism, hyperlidpidemia and HTN who is now s/p R THA.    PT Comments    Pt continues very motivated and with noted improvement in activity tolerance and with decreased level of assist required.   Follow Up Recommendations  Home health PT;SNF     Equipment Recommendations  None recommended by PT    Recommendations for Other Services OT consult     Precautions / Restrictions Precautions Precautions: None Restrictions Weight Bearing Restrictions: No Other Position/Activity Restrictions: WBAT    Mobility  Bed Mobility Overal bed mobility: Needs Assistance Bed Mobility: Supine to Sit;Sit to Supine     Supine to sit: Supervision Sit to supine: Min guard   General bed mobility comments: cues for sequence and use of L LE to self assist  Transfers Overall transfer level: Needs assistance Equipment used: Rolling walker (2 wheeled) Transfers: Sit to/from Stand Sit to Stand: From elevated surface;Min guard         General transfer comment: cues for LE management and use of UEs to self assist  Ambulation/Gait Ambulation/Gait assistance: Min guard Gait Distance (Feet): 170 Feet Assistive device: Rolling walker (2 wheeled) Gait Pattern/deviations: Step-to pattern;Step-through pattern;Decreased step length - right;Decreased step length - left;Shuffle;Trunk flexed Gait velocity: decr   General Gait Details: cues for posture, position from RW and initial sequence   Stairs             Wheelchair Mobility    Modified Rankin (Stroke Patients Only)       Balance Overall balance assessment: Mild deficits observed, not formally tested                                           Cognition Arousal/Alertness: Awake/alert Behavior During Therapy: WFL for tasks assessed/performed Overall Cognitive Status: History of cognitive impairments - at baseline                                 General Comments: Mild dementia - mild forgetfulness and word finding difficulties.      Exercises Total Joint Exercises Ankle Circles/Pumps: AROM;Both;15 reps;Supine Quad Sets: AROM;Both;10 reps;Supine Heel Slides: AAROM;Right;20 reps;Supine Hip ABduction/ADduction: AAROM;Right;15 reps;Supine    General Comments        Pertinent Vitals/Pain Pain Assessment: 0-10 Pain Score: 5  Pain Location: R Hip and knee Pain Descriptors / Indicators: Sore;Aching Pain Intervention(s): Monitored during session;Limited activity within patient's tolerance;Premedicated before session;Ice applied    Home Living                      Prior Function            PT Goals (current goals can now be found in the care plan section) Acute Rehab PT Goals Patient Stated Goal: To have less hip pain PT Goal Formulation: With patient Time For Goal Achievement: 09/29/20 Potential to Achieve Goals: Good Progress towards PT goals: Progressing toward goals    Frequency    7X/week      PT Plan Current plan remains appropriate    Co-evaluation  AM-PAC PT "6 Clicks" Mobility   Outcome Measure  Help needed turning from your back to your side while in a flat bed without using bedrails?: A Little Help needed moving from lying on your back to sitting on the side of a flat bed without using bedrails?: A Little Help needed moving to and from a bed to a chair (including a wheelchair)?: A Little Help needed standing up from a chair using your arms (e.g., wheelchair or bedside chair)?: A Little Help needed to walk in hospital room?: A Little Help needed climbing 3-5 steps with a railing? : A Little 6 Click Score: 18    End of Session Equipment Utilized During  Treatment: Gait belt Activity Tolerance: Patient tolerated treatment well Patient left: with call bell/phone within reach;in bed;with family/visitor present Nurse Communication: Mobility status PT Visit Diagnosis: Difficulty in walking, not elsewhere classified (R26.2)     Time: 3154-0086 PT Time Calculation (min) (ACUTE ONLY): 29 min  Charges:  $Gait Training: 8-22 mins $Therapeutic Exercise: 8-22 mins                     [place] Pager [phone] Office [phone]    Carl Obrien 09/23/2020, 4:43 PM

## 2020-09-23 NOTE — Progress Notes (Signed)
Physical Therapy Treatment Patient Details Name: Carl Obrien MRN: [id number] DOB: 1946/06/12 Today's Date: 09/23/2020    History of Present Illness Patient is a 74 year old man with medical history that includes COPD, dementia, OA, prostate cancer, GERD, history of alcoholism, hyperlidpidemia and HTN who is now s/p R THA.    PT Comments    Pt with increased pain this am but continues very motivated and progressing steadily with mobility.   Follow Up Recommendations  Home health PT;SNF     Equipment Recommendations  None recommended by PT    Recommendations for Other Services OT consult     Precautions / Restrictions Precautions Precautions: None Restrictions Weight Bearing Restrictions: No Other Position/Activity Restrictions: WBAT    Mobility  Bed Mobility Overal bed mobility: Needs Assistance Bed Mobility: Supine to Sit     Supine to sit: Min assist     General bed mobility comments: cues for sequence and use of L LE to self assist  Transfers Overall transfer level: Needs assistance Equipment used: Rolling walker (2 wheeled) Transfers: Sit to/from Stand Sit to Stand: From elevated surface;Min guard         General transfer comment: cues for LE management and use of UEs to self assist  Ambulation/Gait Ambulation/Gait assistance: Min assist Gait Distance (Feet): 125 Feet Assistive device: Rolling walker (2 wheeled) Gait Pattern/deviations: Step-to pattern;Step-through pattern;Decreased step length - right;Decreased step length - left;Shuffle;Trunk flexed Gait velocity: decr   General Gait Details: cues for posture, position from RW and initial sequence   Stairs             Wheelchair Mobility    Modified Rankin (Stroke Patients Only)       Balance Overall balance assessment: Mild deficits observed, not formally tested                                          Cognition Arousal/Alertness: Awake/alert Behavior During  Therapy: WFL for tasks assessed/performed Overall Cognitive Status: History of cognitive impairments - at baseline                                 General Comments: Mild dementia - mild forgetfulness and word finding difficulties.      Exercises Total Joint Exercises Ankle Circles/Pumps: AROM;Both;15 reps;Supine Quad Sets: AROM;Both;10 reps;Supine Heel Slides: AAROM;Right;20 reps;Supine Hip ABduction/ADduction: AAROM;Right;15 reps;Supine    General Comments        Pertinent Vitals/Pain Pain Assessment: 0-10 Pain Score: 6  Pain Location: R Hip and knee Pain Descriptors / Indicators: Grimacing;Guarding;Sore;Spasm Pain Intervention(s): Limited activity within patient's tolerance;Monitored during session;Premedicated before session;Ice applied    Home Living                      Prior Function            PT Goals (current goals can now be found in the care plan section) Acute Rehab PT Goals Patient Stated Goal: To have less hip pain PT Goal Formulation: With patient Time For Goal Achievement: 09/29/20 Potential to Achieve Goals: Good Progress towards PT goals: Progressing toward goals    Frequency    7X/week      PT Plan Current plan remains appropriate    Co-evaluation              AM-PAC  PT "6 Clicks" Mobility   Outcome Measure  Help needed turning from your back to your side while in a flat bed without using bedrails?: A Little Help needed moving from lying on your back to sitting on the side of a flat bed without using bedrails?: A Little Help needed moving to and from a bed to a chair (including a wheelchair)?: A Little Help needed standing up from a chair using your arms (e.g., wheelchair or bedside chair)?: A Little Help needed to walk in hospital room?: A Little Help needed climbing 3-5 steps with a railing? : A Little 6 Click Score: 18    End of Session Equipment Utilized During Treatment: Gait belt Activity Tolerance:  Patient tolerated treatment well Patient left: in chair;with call bell/phone within reach;with family/visitor present Nurse Communication: Mobility status PT Visit Diagnosis: Difficulty in walking, not elsewhere classified (R26.2)     Time: 3810-1751 PT Time Calculation (min) (ACUTE ONLY): 30 min  Charges:  $Gait Training: 8-22 mins $Therapeutic Exercise: 8-22 mins                     [place] Pager [phone] Office [phone]    Rosamae Rocque 09/23/2020, 11:09 AM

## 2020-09-23 NOTE — Plan of Care (Signed)

## 2020-09-24 LAB — CBC
HCT: 32.4 % — ABNORMAL LOW (ref 39.0–52.0)
Hemoglobin: 10.7 g/dL — ABNORMAL LOW (ref 13.0–17.0)
MCH: 30.7 pg (ref 26.0–34.0)
MCHC: 33 g/dL (ref 30.0–36.0)
MCV: 93.1 fL (ref 80.0–100.0)
Platelets: 207 10*3/uL (ref 150–400)
RBC: 3.48 MIL/uL — ABNORMAL LOW (ref 4.22–5.81)
RDW: 14.1 % (ref 11.5–15.5)
WBC: 11.2 10*3/uL — ABNORMAL HIGH (ref 4.0–10.5)
nRBC: 0 % (ref 0.0–0.2)

## 2020-09-24 NOTE — Progress Notes (Signed)
Physical Therapy Treatment Patient Details Name: Carl Obrien MRN: [id number] DOB: [birth year] Today's Date: 09/24/2020    History of Present Illness Patient is a 74 year old man with medical history that includes COPD, dementia, OA, prostate cancer, GERD, history of alcoholism, hyperlidpidemia and HTN who is now s/p R THA.    PT Comments    Pt continues motivated and progressing well with mobility.  Pt states he feels he is mobilizing better than before the surgery and that he feels confident with return to residence in [facility].   Follow Up Recommendations  Home health PT;SNF     Equipment Recommendations  None recommended by PT    Recommendations for Other Services       Precautions / Restrictions Precautions Precautions: None Restrictions Weight Bearing Restrictions: No Other Position/Activity Restrictions: WBAT    Mobility  Bed Mobility Overal bed mobility: Needs Assistance Bed Mobility: Supine to Sit;Sit to Supine     Supine to sit: Modified independent (Device/Increase time) Sit to supine: Modified independent (Device/Increase time)   General bed mobility comments: No cues and no physical assist  Transfers Overall transfer level: Needs assistance Equipment used: Rolling walker (2 wheeled) Transfers: Sit to/from Stand Sit to Stand: Supervision         General transfer comment: cues for use of UEs to self assist  Ambulation/Gait Ambulation/Gait assistance: Min guard;Supervision Gait Distance (Feet): 300 Feet Assistive device: Rolling walker (2 wheeled) Gait Pattern/deviations: Step-to pattern;Step-through pattern;Decreased step length - right;Decreased step length - left;Shuffle;Trunk flexed Gait velocity: decr   General Gait Details: min cues for posture   Stairs             Wheelchair Mobility    Modified Rankin (Stroke Patients Only)       Balance                                             Cognition Arousal/Alertness: Awake/alert Behavior During Therapy: WFL for tasks assessed/performed Overall Cognitive Status: History of cognitive impairments - at baseline                                 General Comments: Mild dementia - mild forgetfulness and word finding difficulties.      Exercises      General Comments        Pertinent Vitals/Pain Pain Assessment: 0-10 Pain Score: 7  Pain Location: right hip Pain Descriptors / Indicators: Sore;Aching Pain Intervention(s): Limited activity within patient's tolerance;Monitored during session;Patient requesting pain meds-RN notified;Ice applied (Pt declined pain meds prior to tx)    Home Living                      Prior Function            PT Goals (current goals can now be found in the care plan section) Acute Rehab PT Goals Patient Stated Goal: To have less hip pain PT Goal Formulation: With patient Time For Goal Achievement: 09/29/20 Potential to Achieve Goals: Good Progress towards PT goals: Progressing toward goals    Frequency    7X/week      PT Plan Current plan remains appropriate    Co-evaluation              AM-PAC PT "6 Clicks" Mobility  Outcome Measure  Help needed turning from your back to your side while in a flat bed without using bedrails?: A Little Help needed moving from lying on your back to sitting on the side of a flat bed without using bedrails?: None Help needed moving to and from a bed to a chair (including a wheelchair)?: A Little Help needed standing up from a chair using your arms (e.g., wheelchair or bedside chair)?: A Little Help needed to walk in hospital room?: A Little Help needed climbing 3-5 steps with a railing? : A Little 6 Click Score: 19    End of Session Equipment Utilized During Treatment: Gait belt Activity Tolerance: Patient tolerated treatment well Patient left: with call bell/phone within reach;in bed;with family/visitor  present Nurse Communication: Mobility status PT Visit Diagnosis: Difficulty in walking, not elsewhere classified (R26.2)     Time: 8527-7824 PT Time Calculation (min) (ACUTE ONLY): 28 min  Charges:  $Gait Training: 8-22 mins                     [place] Pager [phone] Office [phone]    Carl Obrien 09/24/2020, 5:15 PM

## 2020-09-24 NOTE — Progress Notes (Signed)
Physical Therapy Treatment Patient Details Name: Carl Obrien MRN: [id number] DOB: March 04, 1946 Today's Date: 09/24/2020    History of Present Illness Patient is a 74 year old man with medical history that includes COPD, dementia, OA, prostate cancer, GERD, history of alcoholism, hyperlidpidemia and HTN who is now s/p R THA.    PT Comments    Pt continues very motivated and progressing steadily with mobility.  Pt hopeful for dc back to [facility] tomorrow.   Follow Up Recommendations  Home health PT;SNF     Equipment Recommendations  None recommended by PT    Recommendations for Other Services OT consult     Precautions / Restrictions Precautions Precautions: None Restrictions Weight Bearing Restrictions: No Other Position/Activity Restrictions: WBAT    Mobility  Bed Mobility Overal bed mobility: Needs Assistance Bed Mobility: Supine to Sit     Supine to sit: Supervision     General bed mobility comments: cues for sequence and use of L LE to self assist  Transfers Overall transfer level: Needs assistance Equipment used: Rolling walker (2 wheeled) Transfers: Sit to/from Stand Sit to Stand: From elevated surface;Min guard         General transfer comment: cues for LE management and use of UEs to self assist  Ambulation/Gait Ambulation/Gait assistance: Min guard;Supervision Gait Distance (Feet): 200 Feet Assistive device: Rolling walker (2 wheeled) Gait Pattern/deviations: Step-to pattern;Step-through pattern;Decreased step length - right;Decreased step length - left;Shuffle;Trunk flexed Gait velocity: decr   General Gait Details: min cues for posture, position from RW and initial sequence   Stairs             Wheelchair Mobility    Modified Rankin (Stroke Patients Only)       Balance Overall balance assessment: Mild deficits observed, not formally tested                                          Cognition  Arousal/Alertness: Awake/alert Behavior During Therapy: WFL for tasks assessed/performed Overall Cognitive Status: History of cognitive impairments - at baseline                                 General Comments: Mild dementia - mild forgetfulness and word finding difficulties.      Exercises Total Joint Exercises Ankle Circles/Pumps: AROM;Both;15 reps;Supine Quad Sets: AROM;Both;10 reps;Supine Heel Slides: AAROM;Right;20 reps;Supine Hip ABduction/ADduction: AAROM;Right;15 reps;Supine    General Comments        Pertinent Vitals/Pain Pain Assessment: 0-10 Pain Score: 4  Pain Location: R Hip and knee Pain Descriptors / Indicators: Sore;Aching Pain Intervention(s): Limited activity within patient's tolerance;Monitored during session;Premedicated before session    Home Living                      Prior Function            PT Goals (current goals can now be found in the care plan section) Acute Rehab PT Goals Patient Stated Goal: To have less hip pain PT Goal Formulation: With patient Time For Goal Achievement: 09/29/20 Potential to Achieve Goals: Good Progress towards PT goals: Progressing toward goals    Frequency    7X/week      PT Plan Current plan remains appropriate    Co-evaluation  AM-PAC PT "6 Clicks" Mobility   Outcome Measure  Help needed turning from your back to your side while in a flat bed without using bedrails?: A Little Help needed moving from lying on your back to sitting on the side of a flat bed without using bedrails?: None Help needed moving to and from a bed to a chair (including a wheelchair)?: A Little Help needed standing up from a chair using your arms (e.g., wheelchair or bedside chair)?: A Little Help needed to walk in hospital room?: A Little Help needed climbing 3-5 steps with a railing? : A Little 6 Click Score: 19    End of Session Equipment Utilized During Treatment: Gait  belt Activity Tolerance: Patient tolerated treatment well Patient left: with call bell/phone within reach;in bed;with family/visitor present Nurse Communication: Mobility status PT Visit Diagnosis: Difficulty in walking, not elsewhere classified (R26.2)     Time: 6979-4801 PT Time Calculation (min) (ACUTE ONLY): 14 min  Charges:  $Gait Training: 8-22 mins $Therapeutic Exercise: 8-22 mins                     [place] Pager [phone] Office [phone]    Mckenize Mezera 09/24/2020, 9:42 AM

## 2020-09-24 NOTE — Plan of Care (Signed)
  Problem: Education: Goal: Knowledge of General Education information will improve Description: Including pain rating scale, medication(s)/side effects and non-pharmacologic comfort measures Outcome: Progressing   Problem: Clinical Measurements: Goal: Will remain free from infection Outcome: Progressing   Problem: Nutrition: Goal: Adequate nutrition will be maintained Outcome: Progressing   

## 2020-09-24 NOTE — Progress Notes (Signed)
PATIENT ID: Carl Obrien  MRN: [id number]  DOB/AGE:  Apr 05, 1946 / 74 y.o.  2 Days Post-Op Procedure(s) (LRB): TOTAL HIP ARTHROPLASTY ANTERIOR APPROACH (Right)    PROGRESS NOTE Subjective: Patient is alert, oriented, no Nausea, no Vomiting, yes passing gas, . Taking PO well. Denies SOB, Chest or Calf Pain. Using Incentive Spirometer, PAS in place. Ambulate WBAT with pt walking 175 ft. Patient reports pain as  mild .    Objective: Vital signs in last 24 hours: Vitals:   09/23/20 1400 09/23/20 1810 09/23/20 2033 09/24/20 0453  BP: 125/69 126/78 127/68 134/72  Pulse: 64 63 66 61  Resp: 16 20 15 15   Temp: 97.6 F (36.4 C) 98.5 F (36.9 C) 98.8 F (37.1 C) 98.2 F (36.8 C)  TempSrc: Oral Oral Oral Oral  SpO2: 99% 100% 99% 97%  Weight:      Height:          Intake/Output from previous day: I/O last 3 completed shifts: In: 2119 [P.O.:1980; I.V.:2423] Out: 3350 [Urine:3350]   Intake/Output this shift: No intake/output data recorded.   LABORATORY DATA: Recent Labs    09/22/20 0800 09/22/20 0800 09/22/20 0911 09/22/20 0931 09/22/20 1212 09/23/20 0426 09/24/20 0325  WBC 7.7   < >  --   --   --  10.7* 11.2*  HGB 12.7*   < >  --   --   --  10.5* 10.7*  HCT 38.7*   < >  --   --   --  32.1* 32.4*  PLT 270   < >  --   --   --  201 207  NA 138  --   --   --   --   --   --   K 3.3*  --   --   --   --   --   --   CL 103  --   --   --   --   --   --   CO2 25  --   --   --   --   --   --   BUN 19  --   --   --   --   --   --   CREATININE 1.14  --   --   --   --   --   --   GLUCOSE 50*  --   --   --   --   --   --   GLUCAP  --   --  50* 130* 81  --   --   INR 1.0  --   --   --   --   --   --   CALCIUM 9.0  --   --   --   --   --   --    < > = values in this interval not displayed.    Examination: Neurologically intact Neurovascular intact Sensation intact distally Intact pulses distally Dorsiflexion/Plantar flexion intact Incision: dressing C/D/I No cellulitis  present Compartment soft} XR AP&Lat of hip shows well placed\fixed THA  Assessment:   2 Days Post-Op Procedure(s) (LRB): TOTAL HIP ARTHROPLASTY ANTERIOR APPROACH (Right) ADDITIONAL DIAGNOSIS:  Expected Acute Blood Loss Anemia, COPD, dementia, OA, prostate cancer, GERD, history of alcoholism,  Anticipated LOS equal to or greater than 2 midnights due to - Age 53 and older with one or more of the following:  - Obesity  - Expected need for hospital services (PT, OT, Nursing) required  for safe  discharge  - Anticipated need for postoperative skilled nursing care or inpatient rehab  - Active co-morbidities: COPD, dementia, OA, prostate cancer, GERD, history of alcoholism,  OR    - Patient is a high risk of re-admission due to: Barriers to post-acute care (logistical, no family support in home)    Plan: PT/OT WBAT, THA  DVT Prophylaxis: SCDx72 hrs, ASA 325 mg BID x 2 weeks  DISCHARGE PLAN: Skilled Nursing Facility/Rehab  DISCHARGE NEEDS: HHPT, Walker and 3-in-1 comode seat

## 2020-09-24 NOTE — Plan of Care (Signed)
Plan of care reviewed and discussed with the patient. 

## 2020-09-24 NOTE — Progress Notes (Signed)
Occupational Therapy Treatment Patient Details Name: Carl Obrien MRN: [id number] DOB: Mar 01, 1946 Today's Date: 09/24/2020    History of present illness Patient is a 74 year old man with medical history that includes COPD, dementia, OA, prostate cancer, GERD, history of alcoholism, hyperlidpidemia and HTN who is now s/p R THA.   OT comments  Patient able to perform LB dressing today with setup only and no physical assistance. Patient demonstrated ability to stand at sink to perform grooming and perform toilet transfer. Complaints of R hip pain with dressing. Will advance goals to mod I.   Follow Up Recommendations  Home health OT;SNF;Supervision/Assistance - 24 hour    Equipment Recommendations       Recommendations for Other Services      Precautions / Restrictions Precautions Precautions: None Restrictions Weight Bearing Restrictions: No Other Position/Activity Restrictions: WBAT       Mobility Bed Mobility Overal bed mobility: Needs Assistance Bed Mobility: Supine to Sit;Sit to Supine     Supine to sit: Supervision Sit to supine: Supervision   General bed mobility comments: cues for sequence and use of L LE to self assist  Transfers Overall transfer level: Needs assistance Equipment used: Rolling walker (2 wheeled) Transfers: Sit to/from Stand Sit to Stand: From elevated surface;Min guard Stand pivot transfers: Supervision       General transfer comment: ambulated in room to bathroom with rw - supervision only.    Balance Overall balance assessment: Mild deficits observed, not formally tested                                         ADL either performed or assessed with clinical judgement   ADL       Grooming: Standing;Oral care;Supervision/safety Grooming Details (indicate cue type and reason): stood at sink to perform oral care.             Lower Body Dressing: Set up;Supervision/safety;Sit to/from stand Lower Body Dressing  Details (indicate cue type and reason): patient donned underwear and socks without physical assistance or AE. Reports some discomfort at hip but still able to perform. Toilet Transfer: Sales executive;Ambulation;RW Toilet Transfer Details (indicate cue type and reason): supervision for toilet transfer. use of grab bar to stand. Toileting- Clothing Manipulation and Hygiene: Supervision/safety Toileting - Clothing Manipulation Details (indicate cue type and reason): supervision to manage clothing. Patient reports he was able to wipe himself with BM.     Functional mobility during ADLs: Rolling walker;Supervision/safety       Vision Patient Visual Report: No change from baseline     Perception     Praxis      Cognition Arousal/Alertness: Awake/alert Behavior During Therapy: WFL for tasks assessed/performed Overall Cognitive Status: History of cognitive impairments - at baseline                                 General Comments: Mild dementia - mild forgetfulness and word finding difficulties.        Exercises Exercises: Total Joint Total Joint Exercises Ankle Circles/Pumps: AROM;Both;15 reps;Supine Quad Sets: AROM;Both;10 reps;Supine Heel Slides: AAROM;Right;20 reps;Supine Hip ABduction/ADduction: AAROM;Right;15 reps;Supine   Shoulder Instructions       General Comments      Pertinent Vitals/ Pain       Pain Assessment: Faces Pain Score: 4  Faces Pain Scale: Hurts little  more Pain Location: right hip Pain Descriptors / Indicators: Sore;Aching Pain Intervention(s): Monitored during session  Home Living                                          Prior Functioning/Environment              Frequency  Min 2X/week        Progress Toward Goals  OT Goals(current goals can now be found in the care plan section)  Progress towards OT goals: Goals met and updated - see care plan  Acute Rehab OT Goals Patient  Stated Goal: To have less hip pain OT Goal Formulation: With patient Time For Goal Achievement: 10/06/20 Potential to Achieve Goals: Good  Plan Discharge plan remains appropriate    Co-evaluation          OT goals addressed during session: ADL's and self-care      AM-PAC OT "6 Clicks" Daily Activity     Outcome Measure   Help from another person eating meals?: None Help from another person taking care of personal grooming?: A Little Help from another person toileting, which includes using toliet, bedpan, or urinal?: A Little Help from another person bathing (including washing, rinsing, drying)?: A Little Help from another person to put on and taking off regular upper body clothing?: A Little Help from another person to put on and taking off regular lower body clothing?: A Little 6 Click Score: 19    End of Session Equipment Utilized During Treatment: Rolling walker  OT Visit Diagnosis: Other abnormalities of gait and mobility (R26.89);Pain Pain - Right/Left: Right Pain - part of body: Hip   Activity Tolerance Patient tolerated treatment well   Patient Left in bed;with call bell/phone within reach;with bed alarm set   Nurse Communication Mobility status        Time: 9407-6808 OT Time Calculation (min): 11 min  Charges: OT General Charges $OT Visit: 1 Visit OT Treatments $Self Care/Home Management : 8-22 mins  Derl Barrow, OTR/L [place]  Office [phone] Pager: [place] 09/24/2020, 11:13 AM

## 2020-09-25 ENCOUNTER — Encounter (HOSPITAL_COMMUNITY): Payer: Self-pay | Admitting: Orthopedic Surgery

## 2020-09-25 LAB — CBC
HCT: 28.7 % — ABNORMAL LOW (ref 39.0–52.0)
Hemoglobin: 9.4 g/dL — ABNORMAL LOW (ref 13.0–17.0)
MCH: 30.3 pg (ref 26.0–34.0)
MCHC: 32.8 g/dL (ref 30.0–36.0)
MCV: 92.6 fL (ref 80.0–100.0)
Platelets: 186 10*3/uL (ref 150–400)
RBC: 3.1 MIL/uL — ABNORMAL LOW (ref 4.22–5.81)
RDW: 14.5 % (ref 11.5–15.5)
WBC: 10.3 10*3/uL (ref 4.0–10.5)
nRBC: 0 % (ref 0.0–0.2)

## 2020-09-25 MED ORDER — OXYCODONE-ACETAMINOPHEN 5-325 MG PO TABS
1.0000 | ORAL_TABLET | Freq: Four times a day (QID) | ORAL | 0 refills | Status: DC | PRN
Start: 2020-09-25 — End: 2021-01-29

## 2020-09-25 MED ORDER — ASPIRIN 325 MG PO TBEC: 325.0000 mg | DELAYED_RELEASE_TABLET | Freq: Two times a day (BID) | ORAL | 0 refills | Status: DC

## 2020-09-25 MED ORDER — DOCUSATE SODIUM 100 MG PO CAPS: 100.0000 mg | ORAL_CAPSULE | Freq: Two times a day (BID) | ORAL | 0 refills | Status: AC | PRN

## 2020-09-25 MED ORDER — CELECOXIB 100 MG PO CAPS: 100.0000 mg | ORAL_CAPSULE | Freq: Two times a day (BID) | ORAL | 0 refills | Status: AC

## 2020-09-25 MED ORDER — TIZANIDINE HCL 4 MG PO TABS: 4.0000 mg | ORAL_TABLET | Freq: Three times a day (TID) | ORAL | 0 refills | Status: AC | PRN

## 2020-09-25 NOTE — Care Management Important Message (Signed)
Important Message  Patient Details IM Letter given to the Patient Name: Carl Obrien MRN: [id number] Date of Birth: [birth year]   Medicare Important Message Given:  Yes     Kerin Salen 09/25/2020, 12:41 PM

## 2020-09-25 NOTE — Plan of Care (Signed)

## 2020-09-25 NOTE — Progress Notes (Signed)
Physical Therapy Treatment Patient Details Name: Carl Obrien MRN: [id number] DOB: 1946-01-27 Today's Date: 09/25/2020    History of Present Illness Patient is a 74 year old man with medical history that includes COPD, dementia, OA, prostate cancer, GERD, history of alcoholism, hyperlidpidemia and HTN who is now s/p R THA.    PT Comments    Pt found in bed with ice on R hip. Pleasant demeanor. Rates his R hip pain as a 5/10. He requires no assistance, cues, or additional time to get sitting from supine. Min G to get to stand for safety; VCs to push off from bed instead of RW to prevent RW from tipping. Min G for safety with gait 215 ft in hallway. Steady on his feet, no LOB. Minimum VCs to look up at surroundings instead of down at feet, and to stand up tall. Gait performed with no s/o fatigue; pain a "6 or 7/10" when standing and walking. Min G for safety to get to sitting, cues for reaching towards bed when sitting as opposed to holding onto the RW. Completes therex with correct form and demos understanding of exercises and frequency to perform.   Follow Up Recommendations  Home health PT;SNF     Equipment Recommendations  None recommended by PT    Recommendations for Other Services       Precautions / Restrictions Precautions Precautions: None Restrictions Other Position/Activity Restrictions: WBAT    Mobility  Bed Mobility Overal bed mobility: Modified Independent Bed Mobility: Sit to Supine;Supine to Sit     Supine to sit: Supervision Sit to supine: Supervision   General bed mobility comments: No cues and no physical assist  Transfers Overall transfer level: Needs assistance Equipment used: Rolling walker (2 wheeled) Transfers: Sit to/from Stand Sit to Stand: Min guard Stand pivot transfers: Min guard       General transfer comment: min G for safety  Ambulation/Gait Ambulation/Gait assistance: Min guard;Supervision Gait Distance (Feet): 215 Feet Assistive  device: Rolling walker (2 wheeled) Gait Pattern/deviations: Step-through pattern;Decreased stride length;Trunk flexed Gait velocity: decr   General Gait Details: min cues for posture   Stairs             Wheelchair Mobility    Modified Rankin (Stroke Patients Only)       Balance                                            Cognition Arousal/Alertness: Awake/alert Behavior During Therapy: WFL for tasks assessed/performed Overall Cognitive Status: History of cognitive impairments - at baseline                                        Exercises Total Joint Exercises Ankle Circles/Pumps: AROM;Both;Supine;10 reps Quad Sets: AROM;Both;Supine;5 reps Gluteal Sets: AROM;Both;5 reps;Supine Short Arc Quad: AAROM;Right;5 reps;Supine Heel Slides: AAROM;Right;Supine;5 reps Hip ABduction/ADduction: AAROM;Right;Supine;5 reps    General Comments        Pertinent Vitals/Pain Pain Assessment: 0-10 Pain Score: 5  Pain Location: right hip Pain Descriptors / Indicators: Sore;Aching Pain Intervention(s): Monitored during session;Ice applied    Home Living                      Prior Function  PT Goals (current goals can now be found in the care plan section) Acute Rehab PT Goals Patient Stated Goal: To have less hip pain PT Goal Formulation: With patient Time For Goal Achievement: 09/29/20 Potential to Achieve Goals: Good Progress towards PT goals: Progressing toward goals    Frequency    7X/week      PT Plan Current plan remains appropriate    Co-evaluation              AM-PAC PT "6 Clicks" Mobility   Outcome Measure  Help needed turning from your back to your side while in a flat bed without using bedrails?: None Help needed moving from lying on your back to sitting on the side of a flat bed without using bedrails?: None Help needed moving to and from a bed to a chair (including a wheelchair)?: A  Little Help needed standing up from a chair using your arms (e.g., wheelchair or bedside chair)?: A Little Help needed to walk in hospital room?: A Little Help needed climbing 3-5 steps with a railing? : A Little 6 Click Score: 20    End of Session Equipment Utilized During Treatment: Gait belt Activity Tolerance: Patient tolerated treatment well Patient left: with call bell/phone within reach;in bed Nurse Communication: Mobility status PT Visit Diagnosis: Difficulty in walking, not elsewhere classified (R26.2)     Time: 4696-2952 PT Time Calculation (min) (ACUTE ONLY): 26 min  Charges:  $Gait Training: 8-22 mins $Therapeutic Exercise: 8-22 mins                     C. Parks [place], [facility] [phone]

## 2020-09-25 NOTE — TOC Transition Note (Signed)
Transition of Care [facility]) - CM/SW Discharge Note   Patient Details  Name: Carl Obrien MRN: [id number] Date of Birth: Mar 02, 1946  Transition of Care [facility]) CM/SW Contact:  Lennart Pall, LCSW Phone Number: 09/25/2020, 12:20 PM   Clinical Narrative:    Pt medically cleared for dc today.  Pt has made good progress and feels comfortable with plan to return to his ALF/memory care apt at [facility].  HHPT has been recommended and the facility will set this up.  No DME needs.  Pt's brother to provide dc transportation.  No further TOC needs.   Final next level of care: Memory Care Barriers to Discharge: Barriers Resolved   Patient Goals and CMS Choice Patient states their goals for this hospitalization and ongoing recovery are:: return to memory care apt at [facility]                       Discharge Plan and Services                DME Arranged: N/A DME Agency: NA       HH Arranged: PT (to be set up by [facility])          Social Determinants of Health (SDOH) Interventions     Readmission Risk Interventions Readmission Risk Prevention Plan 09/25/2020  Post Dischage Appt Complete  Medication Screening Complete  Transportation Screening Complete

## 2020-09-25 NOTE — Discharge Summary (Signed)
Physician Discharge Summary  Patient ID: Carl Obrien MRN: [id number] DOB/AGE: May 28, 1946 74 y.o.  Admit date: 09/22/2020 Discharge date: 09/25/2020  Admission Diagnoses:  Discharge Diagnoses:  Principal Problem:   Status post total hip replacement, right Active Problems:   Primary osteoarthritis of right hip   Discharged Condition: good  Consults: None  Significant Diagnostic Studies: N/A  Treatments: N/A   Discharge Exam: Blood pressure (!) 146/79, pulse (!) 58, temperature 98 F (36.7 C), temperature source Oral, resp. rate 16, height 5\' 8"  (1.727 m), weight 64.1 kg, SpO2 100 %. General appearance: alert, cooperative, appears stated age and no distress Head: Normocephalic, without obvious abnormality, atraumatic Eyes: negative findings: lids and lashes normal Neck: supple, symmetrical, trachea midline Resp: No retractions  Neurological:  Motor grossly 5/5, symmetrical.  Sensation to light tough symmetrical.  Disposition: Discharge disposition: 03-Skilled Nursing Facility       Discharge Instructions    Call MD for:  difficulty breathing, headache or visual disturbances   Complete by: As directed    Call MD for:  persistant nausea and vomiting   Complete by: As directed    Call MD for:  severe uncontrolled pain   Complete by: As directed    Call MD for:  temperature >100.4   Complete by: As directed    Diet - low sodium heart healthy   Complete by: As directed    Increase activity slowly   Complete by: As directed    Leave dressing on - Keep it clean, dry, and intact until clinic visit   Complete by: As directed      Allergies as of 09/25/2020      Reactions   Ciprofloxacin Other (See Comments)   unknown      Medication List    STOP taking these medications   aspirin 81 MG chewable tablet Replaced by: aspirin 325 MG EC tablet   triamcinolone cream 0.1 % Commonly known as: KENALOG     TAKE these medications   acetaminophen 500 MG  tablet Commonly known as: TYLENOL Take 500 mg by mouth at bedtime. (2100)   albuterol 108 (90 Base) MCG/ACT inhaler Commonly known as: VENTOLIN HFA Inhale 2 puffs into the lungs 2 (two) times daily as needed for wheezing or shortness of breath.   allopurinol 100 MG tablet Commonly known as: ZYLOPRIM Take 100 mg by mouth daily. (0900)   aspirin 325 MG EC tablet Take 1 tablet (325 mg total) by mouth 2 (two) times daily after a meal. Replaces: aspirin 81 MG chewable tablet   atorvastatin 40 MG tablet Commonly known as: LIPITOR Take 40 mg by mouth daily. (2000)   celecoxib 100 MG capsule Commonly known as: CeleBREX Take 1 capsule (100 mg total) by mouth 2 (two) times daily.   cloNIDine 0.1 MG tablet Commonly known as: CATAPRES Take 0.1 mg by mouth every 8 (eight) hours as needed (hypertension.).   COPPERTONE SPORT SPF 30 EX Apply 1 application topically every 2 (two) hours as needed (during sun exposure.).   docusate sodium 100 MG capsule Commonly known as: Colace Take 1 capsule (100 mg total) by mouth 2 (two) times daily as needed (for constipation).   donepezil 5 MG tablet Commonly known as: ARICEPT Take 5 mg by mouth at bedtime. (2000)   fluticasone 50 MCG/ACT nasal spray Commonly known as: FLONASE Place 1 spray into both nostrils 2 (two) times daily. (0900 & 1600)   hydrochlorothiazide 25 MG tablet Commonly known as: HYDRODIURIL Take 25 mg by mouth  daily. (0900)   hydrocortisone 2.5 % ointment Apply 1 application topically 2 (two) times daily. Apply to patches on ribs,back, eyebrows & around eyes twice daily   Scalpicin Maximum Strength 1 % Soln Generic drug: HYDROCORTISONE (TOPICAL) Apply 1 application topically 4 (four) times daily as needed (applied to scalp when needed for irritation).   latanoprost 0.005 % ophthalmic solution Commonly known as: XALATAN Place 1 drop into both eyes at bedtime.   loratadine 10 MG tablet Commonly known as: CLARITIN Take 10  mg by mouth daily.   losartan 50 MG tablet Commonly known as: COZAAR Take 50 mg by mouth 2 (two) times daily. (0900 & 1600)   meloxicam 7.5 MG tablet Commonly known as: MOBIC Take 7.5 mg by mouth daily. (0900)   montelukast 10 MG tablet Commonly known as: SINGULAIR Take 10 mg by mouth daily. (0900)   naltrexone 50 MG tablet Commonly known as: DEPADE Take 50 mg by mouth daily. (0900)   oxyCODONE 5 MG immediate release tablet Commonly known as: Oxy IR/ROXICODONE Take 5 mg by mouth in the morning and at bedtime.   oxyCODONE-acetaminophen 5-325 MG tablet Commonly known as: Percocet Take 1 tablet by mouth every 6 (six) hours as needed for severe pain.   pantoprazole 40 MG tablet Commonly known as: PROTONIX Take 40 mg by mouth daily at 6 (six) AM. (0600)   sertraline 25 MG tablet Commonly known as: ZOLOFT Take 25 mg by mouth at bedtime. (2000)   thiamine 100 MG tablet Take 100 mg by mouth daily. (0900)   timolol 0.5 % ophthalmic solution Commonly known as: BETIMOL Place 1 drop into both eyes 2 (two) times daily. (0900 & 1600)   tiZANidine 4 MG tablet Commonly known as: Zanaflex Take 1 tablet (4 mg total) by mouth every 8 (eight) hours as needed for muscle spasms.   vitamin B-12 1000 MCG tablet Commonly known as: CYANOCOBALAMIN Take 1,000 mcg by mouth daily. (0900)   zolpidem 5 MG tablet Commonly known as: AMBIEN Take 5 mg by mouth at bedtime as needed for sleep.            Durable Medical Equipment  (From admission, onward)         Start     Ordered   09/22/20 1338  DME Walker rolling  Once       Question:  Patient needs a walker to treat with the following condition  Answer:  Primary osteoarthritis of right hip   09/22/20 1337   09/22/20 1338  DME 3 n 1  Once        09/22/20 1337           Discharge Care Instructions  (From admission, onward)         Start     Ordered   09/25/20 0000  Leave dressing on - Keep it clean, dry, and intact until  clinic visit        09/25/20 1115           Signed: Dereck Leep 09/25/2020, 11:15 AM

## 2020-09-25 NOTE — Progress Notes (Signed)
Subjective: 3 Days Post-Op Procedure(s) (LRB): TOTAL HIP ARTHROPLASTY ANTERIOR APPROACH (Right) Patient reports pain as mild.  He denied significant concerns on examination this morning.  He has been working with physical therapy and has been walking up and down the hallway with his walker.  Overall, he is doing very well postoperatively.  Objective: Vital signs in last 24 hours: Temp:  [98 F (36.7 C)-98.3 F (36.8 C)] 98 F (36.7 C) (11/08 0520) Pulse Rate:  [57-64] 58 (11/08 0520) Resp:  [16-18] 16 (11/08 0520) BP: (128-146)/(65-79) 146/79 (11/08 0520) SpO2:  [98 %-100 %] 100 % (11/08 0520)  Intake/Output from previous day: 11/07 0701 - 11/08 0700 In: 1080 [P.O.:1080] Out: 1550 [Urine:1550] Intake/Output this shift: No intake/output data recorded.  Recent Labs    09/23/20 0426 09/24/20 0325 09/25/20 0318  HGB 10.5* 10.7* 9.4*   Recent Labs    09/24/20 0325 09/25/20 0318  WBC 11.2* 10.3  RBC 3.48* 3.10*  HCT 32.4* 28.7*  PLT 207 186   No results for input(s): NA, K, CL, CO2, BUN, CREATININE, GLUCOSE, CALCIUM in the last 72 hours. No results for input(s): LABPT, INR in the last 72 hours.  Neurologically intact ABD soft Neurovascular intact Sensation intact distally Intact pulses distally Dorsiflexion/Plantar flexion intact Incision: dressing C/D/I No cellulitis present Compartment soft   Assessment/Plan: 3 Days Post-Op Procedure(s) (LRB): TOTAL HIP ARTHROPLASTY ANTERIOR APPROACH (Right).At this point, the patient is ready for transfer to a SNF.  We discussed clinic follow-up in 10 days to 2 weeks.  He will need a 3 and 1 walker to discharge.  We will plan for [facility] PT. Advance diet Up with therapy Discharge to SNF   Anticipated LOS equal to or greater than 2 midnights due to - Age 74 and older with one or more of the following:  - Obesity  - Expected need for hospital services (PT, OT, Nursing) required for safe  discharge  - Anticipated need for  postoperative skilled nursing care or inpatient rehab  - Active co-morbidities: History of dementia, social concerns for self-care. OR   - Unanticipated findings during/Post Surgery: Postoperative transitional anemia, pain control, history of dementia, social concerns for self-care postoperatively  - Patient is a high risk of re-admission due to: Barriers to post-acute care (logistical, no family support in home)   Dereck Leep 09/25/2020, 10:12 AM

## 2020-10-02 NOTE — Brief Op Note (Signed)
09/22/2020  11:57 AM  PATIENT:  Carl Obrien  74 y.o. male  PRE-OPERATIVE DIAGNOSIS:  SEVERE RIGHT HIP DEGENERATIVE JOINT DISEASE  POST-OPERATIVE DIAGNOSIS:  SEVERE RIGHT HIP DEGENERATIVE JOINT   PROCEDURE:  Procedure(s): TOTAL HIP ARTHROPLASTY ANTERIOR APPROACH (Right)  SURGEON:  Surgeon(s) and Role:    Dorna Leitz, MD - Primary  PHYSICIAN ASSISTANT:   ASSISTANTS: mike craig   ANESTHESIA:   spinal  EBL:  300 mL   BLOOD ADMINISTERED:none  DRAINS: none   LOCAL MEDICATIONS USED:  MARCAINE    and OTHER expererel  SPECIMEN:  No Specimen  DISPOSITION OF SPECIMEN:  N/A  COUNTS:  YES  TOURNIQUET:  * No tourniquets in log *  DICTATION: .Other Dictation: Dictation Number [id number]  PLAN OF CARE: Admit for overnight observation  PATIENT DISPOSITION:  PACU - hemodynamically stable.   Delay start of Pharmacological VTE agent (>24hrs) due to surgical blood loss or risk of bleeding: no

## 2020-10-02 NOTE — Op Note (Signed)
Carl Obrien, Carl Obrien MEDICAL RECORD DJ:[id number] ACCOUNT [id number] DATE OF BIRTH:Dec 25, 1945 FACILITY: [facility] LOCATION: [facility] PHYSICIAN:Dominique Ressel L. Quintessa Simmerman, MD  OPERATIVE REPORT  DATE OF PROCEDURE:  09/25/2020  PREOPERATIVE DIAGNOSIS:  End-stage degenerative joint disease, right hip.  POSTOPERATIVE DIAGNOSIS:  End-stage degenerative joint disease, right hip.  PRINCIPAL PROCEDURES: 1.  Right total hip replacement with a Corail stem size 12, a 54 mm Pinnacle cup, a 36 mm +5 delta ceramic hip ball, and a neutral liner to accept a 36 mm hip ball. 2.  Interpretation of multiple intraoperative fluoroscopic images.  SURGEON:  Dorna Leitz, MD  ASSISTANT:  Filbert Berthold, PA-C, who was present for the entire case and assisted by manipulation of the leg, retraction of tissues, and closing to minimize OR time.  BRIEF HISTORY:  The patient is a 74 year old male with a long history of significant complaints of right hip pain that had been treated conservatively for a prolonged period of time.  After failure of all conservative care, he is taken to the operating  room for right total hip replacement.  X-ray showed bone-on-bone change.  He was having night pain and light activity pain.  DESCRIPTION OF PROCEDURE:  The patient was taken to the operating room after adequate anesthesia was obtained with a spinal anesthetic.  The patient was placed supine on the operating table.  He was then moved onto the Hana bed, and all bony prominences  were well padded.  Attention was then turned to the right hip where after routine prep and drape an incision was made for an anterior approach to the hip.  Subcutaneous tissue was dissected down to the level of the tensor fascia.  Incision was made in  the fascia, and the muscle was finger dissected.  Retractors put in place above and below the femoral neck.  The capsule was identified, opened, and tagged.  The hip was externally rotated, and the anterior capsule was released  around the front of the  neck.  Attention was then turned to the neck where provisional neck cut was made and the head removed.  Retractors put in place above and below the acetabulum, and the hip externally rotated 60 degrees, and then attention was turned towards the socket.   Labrectomy was performed, and the socket was sequentially reamed to a level of 53 mm, and a 54 mm Pinnacle cup was placed in 45 degrees of lateral opening and 30 degrees of anteversion.  A hole eliminator was placed and a neutral liner placed.   Fluoroscopic guidance was used to help guide the positioning of the cup and to make sure the final reamer was well seated.  Attention at this time was turned to the stem side where the hip was externally rotated, extended, and adducted.  The canal was  opened with a cookie cutter followed by a chili pepper followed by sequential broaching.  It was broached up to a size 12 to hold a Corail stem.  Trial reduction was undertaken to assess leg lengths and positioning.  We felt very good about the size of  the stem and was a little bit loose with a 0 ball, so we went up to a +5 ball.  This gave excellent offset and length.  The hip was then redislocated, and the final size 12 was hammered into place.  A +5 delta ceramic hip ball placed and the hip reduced.   Final fluoroscopic images showed anatomic alignment of his implant with good offset and leg length.  At this  point, the wound was irrigated.  Capsule was closed with  1 Vicryl running.  Exparel and Marcaine were used throughout the hip for  postoperative pain control.  Tensor fascia was closed with 0 Vicryl and skin was closed with a combination of 2-0 Vicryl and 3-0 Monocryl subcuticular.  Benzoin and Steri-Strips were applied.  Sterile compressive dressing was applied.  The patient was  taken to recovery and was noted to be in satisfactory condition.  Estimated blood loss for procedure was 300 mL.  Filbert Berthold was present for the entire case  and assisted by retraction of the leg, manipulation of tissues, and closing to minimize OR time.   The fluoroscopic imaging was used multiple times throughout the case to assess component fit, fill, offset, and leg length.  IN/NUANCE  D:10/02/2020 T:10/02/2020 JOB:[id number]

## 2020-11-20 ENCOUNTER — Telehealth (INDEPENDENT_AMBULATORY_CARE_PROVIDER_SITE_OTHER): Payer: Medicare HMO | Admitting: Physician Assistant

## 2020-11-20 ENCOUNTER — Other Ambulatory Visit: Payer: Self-pay

## 2020-11-20 ENCOUNTER — Encounter: Payer: Self-pay | Admitting: Physician Assistant

## 2020-11-20 DIAGNOSIS — R195 Other fecal abnormalities: Secondary | ICD-10-CM | POA: Diagnosis not present

## 2020-11-20 DIAGNOSIS — J310 Chronic rhinitis: Secondary | ICD-10-CM

## 2020-11-20 MED ORDER — AZELASTINE HCL 0.1 % NA SOLN: 1.0000 | Freq: Two times a day (BID) | NASAL | 12 refills | Status: AC

## 2020-11-20 NOTE — Progress Notes (Signed)
Virtual Visit via Telephone Note  I connected with Carl Obrien on 11/20/20 at  1:00 PM EST by telephone and verified that I am speaking with the correct person using two identifiers.  Location: Patient: Home Provider: [facility]   I discussed the limitations, risks, security and privacy concerns of performing an evaluation and management service by telephone and the availability of in person appointments. I also discussed with the patient that there may be a patient responsible charge related to this service. The patient expressed understanding and agreed to proceed.  History of Present Illness: Patient would like to discuss multiple concerns.   Ongoing drip from the nose. Bilateral but clear-yellow in coloration. Associated with nasal congestion with a slight cough, dry and very infrequent. Has been going on for several months. Occasional sneezing. Denies fever, chills. Denies chest congestion. Is taking Claritin and Singulair daily and using OTC Flonase. Unsure if he had taken anything in the past. Never taken anything else for a long period of time.   Patient does note looser caliber stool for the past few days, going twice daily. Denies melena, hematochezia or tenesmus.   Observations/Objective: No labored breathing.  Speech is clear and coherent with logical content.  Patient is alert and oriented at baseline.   Assessment and Plan: 1. Chronic rhinitis We will have patient continue Singulair and Flonase.  We will switch Claritin for Xyzal.  Rx Astelin.  If no substantial improvement would recommend ENT evaluation.  2. Loose stools Few days.  No fever, chills, malaise or fatigue.  Has whole facility is being Covid tested.  He is to monitor for any development of respiratory symptoms that would raise concern for this.  Otherwise this could be related to diet.  Discussed proper fiber intake and hydration.  Will monitor.   Follow Up Instructions: I  discussed the assessment and treatment plan with the patient. The patient was provided an opportunity to ask questions and all were answered. The patient agreed with the plan and demonstrated an understanding of the instructions.   The patient was advised to call back or seek an in-person evaluation if the symptoms worsen or if the condition fails to improve as anticipated.  I provided 15 minutes of non-face-to-face time during this encounter.   Piedad Climes, PA-C

## 2020-11-20 NOTE — Progress Notes (Signed)
I have discussed the procedure for the virtual visit with the patient who has given consent to proceed with assessment and treatment.   Carl Obrien S Talissa Apple, CMA     

## 2020-11-27 ENCOUNTER — Telehealth: Payer: Self-pay | Admitting: Physician Assistant

## 2020-11-27 NOTE — Telephone Encounter (Signed)
Patient called stating that he has various questions about medication he received last week.  Please give him a call back at your convenience.

## 2020-11-30 ENCOUNTER — Telehealth: Payer: Self-pay

## 2020-11-30 NOTE — Telephone Encounter (Signed)
Called patient back about questions about meds. Pt did not answer. Left vm to return call to office to discuss concerns.

## 2020-11-30 NOTE — Telephone Encounter (Signed)
Left vm to return call to office to discuss concerns.

## 2020-12-25 ENCOUNTER — Telehealth: Payer: Self-pay | Admitting: Dermatology

## 2020-12-25 NOTE — Telephone Encounter (Signed)
Wife called; ask for Loletta Specter. He's in assisted living. Psoriasis itching is more extensive. Patient wants to know if he can get 5% version of Triamcinolone (he  looked on box and found out it exists) Pharmacy: [facility]. ST patient

## 2020-12-26 NOTE — Telephone Encounter (Signed)
Spoke with Ann(patient sister in law)- informed ann that Triamcinolone Cream doesn't come in 5%- only 0.1% which is what Dr. Denna Haggard has given in past.  Informed her that Dr. Denna Haggard will give refills for a year as long as patient has been seen & patient had been seen.  Ann wasn't sure if Pavlos was using on same existing spots or if he was getting new spots and using on the face.  [name] is going to investigate and call back. Ann wanted a stronger topical per Laithan but informed ann that certain steroids can only be used on certain parts of the body. Ann understood and will call me back to let me know if patient just wants a refill on Triamcinolone 0.1 % cream or if he needs a office visit.

## 2021-01-03 ENCOUNTER — Other Ambulatory Visit: Payer: Self-pay

## 2021-01-03 ENCOUNTER — Encounter: Payer: Self-pay | Admitting: Dermatology

## 2021-01-03 ENCOUNTER — Ambulatory Visit (INDEPENDENT_AMBULATORY_CARE_PROVIDER_SITE_OTHER): Payer: Medicare HMO | Admitting: Dermatology

## 2021-01-03 DIAGNOSIS — Z79899 Other long term (current) drug therapy: Secondary | ICD-10-CM

## 2021-01-03 DIAGNOSIS — L400 Psoriasis vulgaris: Secondary | ICD-10-CM | POA: Diagnosis not present

## 2021-01-03 MED ORDER — BETAMETHASONE DIPROPIONATE 0.05 % EX OINT: TOPICAL_OINTMENT | CUTANEOUS | 0 refills | Status: DC

## 2021-01-03 NOTE — Patient Instructions (Addendum)
Risankizumab What is this medicine? RISANKIZUMAB (RIS an KIZ ue mab) is used to treat plaque psoriasis. This medicine may be used for other purposes; ask your health care provider or pharmacist if you have questions. COMMON BRAND NAME(S): Skyrizi What should I tell my health care provider before I take this medicine? They need to know if you have any of these conditions:  immune system problems  infection  recently received or are scheduled to receive a vaccine  tuberculosis, a positive skin test for tuberculosis, or have recently been in close contact with someone who has tuberculosis  an unusual or allergic reaction to risankizumab, other medicines, foods, dyes or preservatives  pregnant or trying to get pregnant  breast-feeding How should I use this medicine? This medicine is for injection under the skin. It is usually given by a health care professional in a hospital or clinic setting. If you get this medicine at home, you will be taught how to prepare and give this medicine. Use exactly as directed. Take your medicine at regular intervals. Do not take your medicine more often than directed. It is important that you put your used needles and syringes in a special sharps container. Do not put them in a trash can. If you do not have a sharps container, call your pharmacist or healthcare provider to get one. A special MedGuide will be given to you by the pharmacist with each prescription and refill. Be sure to read this information carefully each time. Talk to your pediatrician regarding the use of this medicine in children. Special care may be needed. Overdosage: If you think you have taken too much of this medicine contact a poison control center or emergency room at once. NOTE: This medicine is only for you. Do not share this medicine with others. What if I miss a dose? If you miss a dose, take it as soon as you can. Then, continue dosing at the regularly scheduled time. What may  interact with this medicine? Do not take this medicine with any of the following medications:  live virus vaccines This medicine may also interact with the following medications:  inactivated vaccines This list may not describe all possible interactions. Give your health care provider a list of all the medicines, herbs, non-prescription drugs, or dietary supplements you use. Also tell them if you smoke, drink alcohol, or use illegal drugs. Some items may interact with your medicine. What should I watch for while using this medicine? Tell your doctor or health care professional if your symptoms do not start to get better or if they get worse. You will be tested for tuberculosis (TB) before you start this medicine. If your doctor prescribes any medicine for TB, you should start taking the TB medicine before starting this medicine. Make sure to finish the full course of TB medicine. Call your doctor or health care professional for advice if you get a fever, chills or sore throat, or other symptoms of a cold or flu. Do not treat yourself. This drug decreases your body's ability to fight infections. Try to avoid being around people who are sick. This medicine can decrease the response to a vaccine. If you need to get vaccinated, tell your health care professional if you have received this medicine. Extra booster doses may be needed. Talk to your doctor to see if a different vaccination schedule is needed. What side effects may I notice from receiving this medicine? Side effects that you should report to your doctor or health care  professional as soon as possible:  allergic reactions like skin rash; itching or hives; swelling of the face, lips, or tongue  sign and symptoms of an infection like fever or chills; cough; sore throat; pain or trouble passing urine  swollen lymph nodes in the neck, underarm, or groin areas  unusually weak or tired  breathing problems  unexplained weight loss Side  effects that usually do not require medical attention (report these to your doctor or health care professional if they continue or are bothersome):  headache  pain, redness, or irritation at site where injected  tiredness This list may not describe all possible side effects. Call your doctor for medical advice about side effects. You may report side effects to FDA at 1-800-FDA-1088. Where should I keep my medicine? Keep out of the reach of children. Store in a refrigerator between 2 and 8 degrees C (36 and 46 degrees F) in the original carton. Do not freeze. Keep in the original carton to protect from light. Throw away any unused medicine after the expiration date. NOTE: This sheet is a summary. It may not cover all possible information. If you have questions about this medicine, talk to your doctor, pharmacist, or health care provider.  2021 Elsevier/Gold Standard (2018-03-11 15:33:11)    Psoriasis  What causes psoriasis? A patient's immune system plays a role in the development of psoriasis through the over-activity of a type of white blood cell called a T cell.  Once these cells are activated, a reaction is triggered that causes the skin to grow faster than normal.  New skin cells form in days instead of weeks, and these cells do not shed.  These cells pile up on the skin, and this results in what you see as psoriasis.  It is not contagious.  There is a genetic component to psoriasis, although not everyone inherits the gene.  What triggers psoriasis? Common triggers are stress, strep throat infection (more commonly in kids), and cold weather.  During stressful events, psoriasis tends to flare.  Certain medications such as lithium, some blood pressure medications, and some drugs used to treat malaria can trigger psoriasis.  A skin injury can also trigger psoriasis to develop at the site of injury.  Types of Psoriasis 1. Plaque psoriasis:  80% of patients with psoriasis have plaque  psoriasis.  Plaques usually form on elbows, knees, and lower back and appear raised and reddish with a silvery white scale. 2. Nail psoriasis:  Fingernails and toenails may be affected.  Initially small pits may form, then with time, the nails thicken, lift up, and crumble.   3. Scalp psoriasis:  Similar in appearance to plaque psoriasis on the body.  Tends to be itchy.  Clinically can resemble dandruff because of the scales that can fall on a patient's shirt.  This may be difficult to control. 4. Pustular psoriasis:  Usually involves the palms and soles appearing as white, pus-filled bumps.  Rarely, it may develop all over the body, which can make patients seriously ill. 5. Guttate psoriasis:  Usually occurs in children and young adults.  The lesions are smaller than plaque psoriasis.  May be triggered by a strep throat infection.  Many times it clears on its own, and the patient may or may not develop psoriasis again.   6. Inverse psoriasis:  Involves the folds of the skin and may be painful.  This appears as red, shiny patches.  It may involve the armpits, under the breasts, the genital area, or  the crease of the buttock.  7. Psoriatic arthritis:  Up to one third of patients with psoriasis will develop arthritis.  It commonly affects the hands, feet, and spine, and may begin as stiffness.  If untreated it may lead to permanent joint damage.  Treatment There is no cure for psoriasis, but it can be controlled.  Some patients undergo more than one type of treatment.  1. Topical medications (applied externally to the skin) a. Corticosteroids:  typically first-line treatment for psoriasis.  They control the inflammation of psoriasis.  They are available as creams, ointments, sprays, foams, or lotions.  It is important to follow your dermatologist's instructions as to how to apply the medicine.  For example, excessive use of strong steroid creams (especially to body creases and the face) can cause thinning  and lightening of the skin.  This can lead to the formation of stretch marks in the folds.   b. Calcipotriene and calcipotriol (Vitamin D derivatives):  These are usually used in conjunction with a steroid cream.  Sometimes they may be used as maintenance once psoriasis is under control. c. Retinoids:  sometimes used in conjunction with a topical steroid cream.  Women should not use retinoids if they are pregnant. d. Coal tar:  an older treatment that is still effective, especially in combination with steroids.  It can be messy and may have an odor in some preparations. 2. Light treatments:  may provide a safe and effective treatment for psoriasis.  The main risks of light therapy are sunburn and possible increase in skin cancers.   a. Laser therapy:  can treat a certain stubborn area of psoriasis such as scalp, feet, and hands.  It is not used for large areas. b. Narrowband UVB:  A patient stands in front of panels of lights for a set amount of time.  A typical course might be 24 treatments over 2 months.  c. PUVA:  This treatment combines exposure to UVA light with light-sensitizing medication called psoralen.  It may come as a pill or as a lotion.  The main side effects are nausea (from the psoralen pill) and significantly increased risk of skin cancer. 3. Oral medications:  used for moderate to severe psoriasis.  These medications are very effective, but they have a number of potentially serious side effects. a. Methotrexate b. Soriatane (acitretin) c. Cyclosporine 4. Biologic medications:  used for moderate to severe psoriasis.  These are newer medications that suppress the immune cells responsible for psoriasis.  They generally produce good results, but they all increase the risk of infection.  These are given as injections or IV infusions. a. Enbrel (etanercept) b. Humira (adalimumab) c. Remicade (infliximab) d. Stelara (ustekinumab)  Recent studies have found that patients with psoriasis  are more prone to developing metabolic syndrome:  high blood pressure, coronary artery disease, high cholesterol, and diabetes.  It is very important for patients with psoriasis to live healthy lifestyles.  Diet and exercise are important.  Support groups:   www.psoriasis.org , http://www.[facility].com/psoriasisnet/index.html

## 2021-01-04 NOTE — Telephone Encounter (Signed)
Carl Obrien with [facility] said we could disregaurd the fax it was already filled for the betamethasone

## 2021-01-05 LAB — COMPREHENSIVE METABOLIC PANEL
AG Ratio: 2.2 (calc) (ref 1.0–2.5)
ALT: 16 U/L (ref 9–46)
AST: 17 U/L (ref 10–35)
Albumin: 4.4 g/dL (ref 3.6–5.1)
Alkaline phosphatase (APISO): 93 U/L (ref 35–144)
BUN/Creatinine Ratio: 17 (calc) (ref 6–22)
BUN: 24 mg/dL (ref 7–25)
CO2: 25 mmol/L (ref 20–32)
Calcium: 9 mg/dL (ref 8.6–10.3)
Chloride: 105 mmol/L (ref 98–110)
Creat: 1.4 mg/dL — ABNORMAL HIGH (ref 0.70–1.18)
Globulin: 2 g/dL (calc) (ref 1.9–3.7)
Glucose, Bld: 76 mg/dL (ref 65–139)
Potassium: 4.1 mmol/L (ref 3.5–5.3)
Sodium: 140 mmol/L (ref 135–146)
Total Bilirubin: 0.4 mg/dL (ref 0.2–1.2)
Total Protein: 6.4 g/dL (ref 6.1–8.1)

## 2021-01-05 LAB — CBC WITH DIFFERENTIAL/PLATELET
Absolute Monocytes: 644 cells/uL (ref 200–950)
Basophils Absolute: 20 cells/uL (ref 0–200)
Basophils Relative: 0.3 %
Eosinophils Absolute: 150 cells/uL (ref 15–500)
Eosinophils Relative: 2.3 %
HCT: 40.5 % (ref 38.5–50.0)
Hemoglobin: 13.6 g/dL (ref 13.2–17.1)
Lymphs Abs: 1658 cells/uL (ref 850–3900)
MCH: 30.6 pg (ref 27.0–33.0)
MCHC: 33.6 g/dL (ref 32.0–36.0)
MCV: 91.2 fL (ref 80.0–100.0)
MPV: 10.4 fL (ref 7.5–12.5)
Monocytes Relative: 9.9 %
Neutro Abs: 4030 cells/uL (ref 1500–7800)
Neutrophils Relative %: 62 %
Platelets: 224 10*3/uL (ref 140–400)
RBC: 4.44 10*6/uL (ref 4.20–5.80)
RDW: 14.1 % (ref 11.0–15.0)
Total Lymphocyte: 25.5 %
WBC: 6.5 10*3/uL (ref 3.8–10.8)

## 2021-01-05 LAB — QUANTIFERON-TB GOLD PLUS
Mitogen-NIL: >10 IU/mL
NIL: 0.03 IU/mL
QuantiFERON-TB Gold Plus: NEGATIVE
TB1-NIL: 0 IU/mL
TB2-NIL: 0 IU/mL

## 2021-01-05 LAB — HEPATITIS B SURFACE ANTIBODY, QUANTITATIVE: Hep B S AB Quant (Post): 287 m[IU]/mL (ref >=10)

## 2021-01-05 LAB — HEPATITIS C ANTIBODY
Hepatitis C Ab: NONREACTIVE
SIGNAL TO CUT-OFF: 0.01 (ref <1.00)

## 2021-01-05 LAB — HEPATITIS B SURFACE ANTIGEN: Hepatitis B Surface Ag: NONREACTIVE

## 2021-01-05 LAB — ANA: Anti Nuclear Antibody (ANA): NEGATIVE

## 2021-01-05 LAB — HEPATITIS B CORE ANTIBODY, TOTAL: Hep B Core Total Ab: REACTIVE — AB

## 2021-01-08 ENCOUNTER — Encounter: Payer: Self-pay | Admitting: Dermatology

## 2021-01-08 ENCOUNTER — Telehealth: Payer: Self-pay

## 2021-01-08 NOTE — Telephone Encounter (Signed)
Spoke with Carl Obrien she is on patient hippa form and will she  update patient and make his follow up with primary care.

## 2021-01-08 NOTE — Telephone Encounter (Signed)
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  He will need to be cleared by an internist or gastroenterologist before he can proceed with a biologic.

## 2021-01-08 NOTE — Progress Notes (Signed)
Follow-Up Visit   Subjective  Carl Obrien is a 75 y.o. male who presents for the following: Psoriasis ( Full body- worse part is back/shoulder blades tx- TAC 0.1 cream- psoriasis seems to be getting worse).  Psoriasis Location: All over Duration:  Did state that this is very bothersome to him and affects his sleeping Associated Signs/Symptoms: Modifying Factors:  Severity:  Timing: Context:   Objective  Well appearing patient in no apparent distress; mood and affect are within normal limits. Objective  Left Abdomen (side) - Lower, Left Elbow - Posterior, Left Lower Back, Mid Back, Neck - Posterior, Right Elbow - Posterior, Right Hip (side) - Posterior: Moderately thick large plaque psoriasis now involving 20+ percent of his body surface area with most severe involvement on lower trunk and scalp.  Once again all possible treatment options reviewed with uncertain patient receptivity.    A full examination was performed including scalp, head, eyes, ears, nose, lips, neck, chest, axillae, abdomen, back, buttocks, bilateral upper extremities, bilateral lower extremities, hands, feet, fingers, toes, fingernails, and toenails. All findings within normal limits unless otherwise noted below.   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  Obtain baseline labs.  Increase strength of topical to betamethasone debris and 8 pending approval of biologic.  betamethasone dipropionate (DIPROLENE) 0.05 % ointment - Left Abdomen (side) - Lower, Left Elbow - Posterior, Left Lower Back, Mid Back, Neck - Posterior, Right Elbow - Posterior, Right Hip (side) - Posterior  Comprehensive metabolic panel - Left Abdomen (side) - Lower, Left Elbow - Posterior, Left Lower Back, Mid Back, Neck - Posterior, Right Elbow -  Posterior, Right Hip (side) - Posterior  CBC with Differential/Platelet - Left Abdomen (side) - Lower, Left Elbow - Posterior, Left Lower Back, Mid Back, Neck - Posterior, Right Elbow - Posterior, Right Hip (side) - Posterior  Hepatitis B surface antibody,quantitative - Left Abdomen (side) - Lower, Left Elbow - Posterior, Left Lower Back, Mid Back, Neck - Posterior, Right Elbow - Posterior, Right Hip (side) - Posterior  Hepatitis B surface antigen - Left Abdomen (side) - Lower, Left Elbow - Posterior, Left Lower Back, Mid Back, Neck - Posterior, Right Elbow - Posterior, Right Hip (side) - Posterior  Hepatitis B core antibody, total - Left Abdomen (side) - Lower, Left Elbow - Posterior, Left Lower Back, Mid Back, Neck - Posterior, Right Elbow - Posterior, Right Hip (side) - Posterior  Hepatitis C antibody - Left Abdomen (side) - Lower, Left Elbow - Posterior, Left Lower Back, Mid Back, Neck - Posterior, Right Elbow - Posterior, Right Hip (side) - Posterior  QuantiFERON-TB Gold Plus - Left Abdomen (side) - Lower, Left Elbow - Posterior, Left Lower Back, Mid Back, Neck - Posterior, Right Elbow - Posterior, Right Hip (side) - Posterior  ANA - Left Abdomen (side) - Lower, Left Elbow - Posterior, Left Lower Back, Mid Back, Neck - Posterior, Right Elbow - Posterior, Right Hip (side) - Posterior  Drug therapy  Other Related Procedures Comprehensive metabolic panel CBC with Differential/Platelet Hepatitis B surface antibody,quantitative Hepatitis B surface antigen Hepatitis B core antibody, total Hepatitis C antibody QuantiFERON-TB Gold Plus ANA      I, Lavonna Monarch, MD, have reviewed all documentation for this visit.  The documentation on 01/08/21 for the exam, diagnosis, procedures, and orders are all  accurate and complete.

## 2021-01-19 ENCOUNTER — Other Ambulatory Visit: Payer: Self-pay

## 2021-01-19 ENCOUNTER — Ambulatory Visit (INDEPENDENT_AMBULATORY_CARE_PROVIDER_SITE_OTHER): Payer: Medicare HMO | Admitting: Physician Assistant

## 2021-01-19 ENCOUNTER — Encounter: Payer: Self-pay | Admitting: Physician Assistant

## 2021-01-19 VITALS — BP 114/70 | HR 65 | Temp 98.2°F | Ht 68.0 in | Wt 149.2 lb

## 2021-01-19 DIAGNOSIS — I10 Essential (primary) hypertension: Secondary | ICD-10-CM

## 2021-01-19 DIAGNOSIS — F0390 Unspecified dementia without behavioral disturbance: Secondary | ICD-10-CM | POA: Insufficient documentation

## 2021-01-19 DIAGNOSIS — L400 Psoriasis vulgaris: Secondary | ICD-10-CM | POA: Diagnosis not present

## 2021-01-19 DIAGNOSIS — D509 Iron deficiency anemia, unspecified: Secondary | ICD-10-CM

## 2021-01-19 DIAGNOSIS — R768 Other specified abnormal immunological findings in serum: Secondary | ICD-10-CM | POA: Diagnosis not present

## 2021-01-19 DIAGNOSIS — Z8546 Personal history of malignant neoplasm of prostate: Secondary | ICD-10-CM

## 2021-01-19 DIAGNOSIS — J449 Chronic obstructive pulmonary disease, unspecified: Secondary | ICD-10-CM

## 2021-01-19 NOTE — Patient Instructions (Signed)
Good to meet you today! Please go to the lab for a Hemosure test. I will send referral to GI and they should call about this to schedule. I am also scheduling you with [facility] for medication management. They will contact you about appointment.  I will see you back in 6 months for medication recheck.  Call sooner if you have any concerns.

## 2021-01-19 NOTE — Progress Notes (Signed)
Established Patient Office Visit  Subjective:  Patient ID: Carl Obrien, male    DOB: 14-Oct-1946  Age: 75 y.o. MRN: [id number]  HPI  Cy Bresee presents for Transition of Care from [facility], PA-C. (Dr. Kellie Shropshire was previous PCP in [place].) He currently resides at [facility], and states the pharmacy is [place] in [place]. Here with his sister-in-law, [name]. He has a history of dementia and can only remember certain parts of his medical history.  Struggling with plaque psoriasis for several years. Recently saw Dr. Denna Haggard on 01-03-21.  Several labs were done in order to start a biologic agent.  There was concern that his hep B core antibody was reactive as well as a noticeable iron deficient anemia and some elevated liver enzymes.  The recommendation was for him to see GI specialist and get clearance prior to starting this medication for his psoriasis.  Other than the psoriasis bothering him, he does not have any other complaints today.  His sister-in-law is concerned about his medication list.  She is not sure what exactly he is still taking or not and who is managing the medications.  Past Medical History:  Diagnosis Date  . Cancer [facility])    prostate, s/p seed implant  . COPD (chronic obstructive pulmonary disease) ([place])   . Dementia ([place])   . Dementia ([place])   . GERD (gastroesophageal reflux disease)   . History of alcoholism ([place])   . Hyperlipidemia   . Hypertension     Past Surgical History:  Procedure Laterality Date  . INSERTION PROSTATE RADIATION SEED    . 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 Liver cancer Mother     Social History   Socioeconomic History  . Marital status: Single    Spouse name: Not on file  . Number of children: 0   . Years of education: Not on file  . Highest education level: Master's degree (e.g., MA, MS, MEng, MEd, MSW, MBA)  Occupational History    Comment: retired  Tobacco Use  . Smoking status: Former Research scientist (life sciences)  . Smokeless tobacco: Never Used  . Tobacco comment: years ago  Substance and Sexual Activity  . Alcohol use: Not Currently    Comment: quit Feb 2021  . Drug use: Never  . Sexual activity: Not on file  Other Topics Concern  . Not on file  Social History Narrative   04/04/20 since 01/21/20 lives at [facility] , memory care    Social Determinants of Health   Financial Resource Strain: Not on file  Food Insecurity: Not on file  Transportation Needs: Not on file  Physical Activity: Not on file  Stress: Not on file  Social Connections: Not on file  Intimate Partner Violence: Not on file    Outpatient Medications Prior to Visit  Medication Sig Dispense Refill  . acetaminophen (TYLENOL) 500 MG tablet Take 500 mg by mouth at bedtime. (2100)    . albuterol (VENTOLIN HFA) 108 (90 Base) MCG/ACT inhaler Inhale 2 puffs into the lungs 2 (two) times daily as needed for wheezing or shortness of breath.     . allopurinol (ZYLOPRIM) 100 MG tablet Take 100 mg by mouth daily. (0900)    . aspirin EC 325 MG EC tablet Take 1  tablet (325 mg total) by mouth 2 (two) times daily after a meal. 60 tablet 0  . atorvastatin (LIPITOR) 40 MG tablet Take 40 mg by mouth daily. (2000)    . azelastine (ASTELIN) 0.1 % nasal spray Place 1 spray into both nostrils 2 (two) times daily. Use in each nostril as directed 30 mL 12  . betamethasone dipropionate (DIPROLENE) 0.05 % ointment Apply to affected area daily after bathing 50 g 0  . cloNIDine (CATAPRES) 0.1 MG tablet Take 0.1 mg by mouth every 8 (eight) hours as needed (hypertension.).     Marland Kitchen docusate sodium (COLACE) 100 MG capsule Take 1 capsule (100 mg total) by mouth 2 (two) times daily as needed (for constipation). 30 capsule 0  . donepezil (ARICEPT)  5 MG tablet Take 5 mg by mouth at bedtime. (2000)    . fluticasone (FLONASE) 50 MCG/ACT nasal spray Place 1 spray into both nostrils 2 (two) times daily. (0900 & 1600)    . hydrochlorothiazide (HYDRODIURIL) 25 MG tablet Take 25 mg by mouth daily. (0900)    . HYDROCORTISONE, TOPICAL, (SCALPICIN MAXIMUM STRENGTH) 1 % SOLN Apply 1 application topically 4 (four) times daily as needed (applied to scalp when needed for irritation).    Marland Kitchen latanoprost (XALATAN) 0.005 % ophthalmic solution Place 1 drop into both eyes at bedtime.     Marland Kitchen loratadine (CLARITIN) 10 MG tablet Take 10 mg by mouth daily.    Marland Kitchen losartan (COZAAR) 50 MG tablet Take 50 mg by mouth 2 (two) times daily. (0900 & 1600)    . meloxicam (MOBIC) 7.5 MG tablet Take 7.5 mg by mouth daily. (0900)    . montelukast (SINGULAIR) 10 MG tablet Take 10 mg by mouth daily. (0900)    . naltrexone (DEPADE) 50 MG tablet Take 50 mg by mouth daily. (0900)    . oxyCODONE (OXY IR/ROXICODONE) 5 MG immediate release tablet Take 5 mg by mouth in the morning and at bedtime.    Marland Kitchen oxyCODONE-acetaminophen (PERCOCET) 5-325 MG tablet Take 1 tablet by mouth every 6 (six) hours as needed for severe pain. 28 tablet 0  . pantoprazole (PROTONIX) 40 MG tablet Take 40 mg by mouth daily at 6 (six) AM. (0600)    . sertraline (ZOLOFT) 25 MG tablet Take 25 mg by mouth at bedtime. (2000)    . Sunscreens (COPPERTONE SPORT SPF 30 EX) Apply 1 application topically every 2 (two) hours as needed (during sun exposure.).    Marland Kitchen thiamine 100 MG tablet Take 100 mg by mouth daily. (0900)    . timolol (BETIMOL) 0.5 % ophthalmic solution Place 1 drop into both eyes 2 (two) times daily. (0900 & 1600)    . tiZANidine (ZANAFLEX) 4 MG tablet Take 1 tablet (4 mg total) by mouth every 8 (eight) hours as needed for muscle spasms. 60 tablet 0  . vitamin B-12 (CYANOCOBALAMIN) 1000 MCG tablet Take 1,000 mcg by mouth daily. (0900)    . zolpidem (AMBIEN) 5 MG tablet Take 5 mg by mouth at bedtime as needed for  sleep.      No facility-administered medications prior to visit.    Allergies  Allergen Reactions  . Ciprofloxacin Other (See Comments)    unknown    ROS Review of Systems  Constitutional: Negative for activity change, appetite change and unexpected weight change.  HENT: Negative for congestion.   Eyes: Negative for visual disturbance.  Respiratory: Negative for apnea and shortness of breath.   Cardiovascular: Negative for chest pain.  Gastrointestinal:  Negative for abdominal pain and blood in stool.  Endocrine: Negative for polydipsia, polyphagia and polyuria.  Genitourinary: Negative for difficulty urinating.  Musculoskeletal: Negative for arthralgias.  Skin: Negative for rash.  Neurological: Negative for seizures, weakness and headaches.  Psychiatric/Behavioral: Negative for sleep disturbance and suicidal ideas.      Objective:    Physical Exam Vitals and nursing note reviewed. Exam conducted with a chaperone present.  Constitutional:      General: He is not in acute distress.    Appearance: Normal appearance.  HENT:     Head: Normocephalic.     Right Ear: External ear normal.     Left Ear: External ear normal.  Eyes:     Extraocular Movements: Extraocular movements intact.     Pupils: Pupils are equal, round, and reactive to light.  Cardiovascular:     Rate and Rhythm: Normal rate.     Pulses: Normal pulses.     Heart sounds: Normal heart sounds. No murmur heard.   Pulmonary:     Effort: Pulmonary effort is normal.     Breath sounds: Normal breath sounds.  Skin:    Comments: LARGE PLAQUE PSORIASIS PATCHES ON BACK AND SCALP  Neurological:     General: No focal deficit present.     Mental Status: He is alert. Mental status is at baseline.     Comments: GOOD STRENGTH ALL EXTREMITIES  Psychiatric:        Mood and Affect: Mood normal.        Behavior: Behavior normal.     BP 114/70   Pulse 65   Temp 98.2 F (36.8 C)   Ht 5\' 8"  (1.727 m)   Wt 149 lb  3.2 oz (67.7 kg)   SpO2 98%   BMI 22.69 kg/m  Wt Readings from Last 3 Encounters:  01/19/21 149 lb 3.2 oz (67.7 kg)  09/22/20 141 lb 6.4 oz (64.1 kg)  07/19/20 147 lb (66.7 kg)     Health Maintenance Due  Topic Date Due  . COLONOSCOPY (Pts 45-46yrs Insurance coverage will need to be confirmed)  Never done    There are no preventive care reminders to display for this patient.  Lab Results  Component Value Date   TSH 1.67 10/06/2019   Lab Results  Component Value Date   WBC 6.5 01/03/2021   HGB 13.6 01/03/2021   HCT 40.5 01/03/2021   MCV 91.2 01/03/2021   PLT 224 01/03/2021   Lab Results  Component Value Date   NA 140 01/03/2021   K 4.1 01/03/2021   CO2 25 01/03/2021   GLUCOSE 76 01/03/2021   BUN 24 01/03/2021   CREATININE 1.40 (H) 01/03/2021   BILITOT 0.4 01/03/2021   ALKPHOS 91 09/22/2020   AST 17 01/03/2021   ALT 16 01/03/2021   PROT 6.4 01/03/2021   ALBUMIN 4.1 09/22/2020   CALCIUM 9.0 01/03/2021   ANIONGAP 10 09/22/2020   GFR 37.99 (L) 07/19/2020   Lab Results  Component Value Date   CHOL 225 (A) 11/17/2019   Lab Results  Component Value Date   HDL 69 11/17/2019   Lab Results  Component Value Date   LDLCALC 138 11/17/2019   Lab Results  Component Value Date   TRIG 102 11/17/2019   No results found for: CHOLHDL No results found for: HGBA1C    Assessment & Plan:   Problem List Items Addressed This Visit      Cardiovascular and Mediastinum   Essential hypertension  Respiratory   Chronic obstructive pulmonary disease (HCC)     Nervous and Auditory   Dementia without behavioral disturbance (HCC)   Relevant Orders   AMB Referral to [facility] Care Coordinaton     Musculoskeletal and Integument   Plaque psoriasis   Relevant Orders   AMB Referral to Community Care Coordinaton     Other   Personal history of prostate cancer    Other Visit Diagnoses    Iron deficiency anemia, unspecified iron deficiency anemia type    -  Primary    Relevant Orders   IFOBT POC (occult bld, rslt in office)   Ambulatory referral to Gastroenterology   AMB Referral to [facility] Coordinaton   Hepatitis B core antibody positive       Relevant Orders   Ambulatory referral to Gastroenterology   AMB Referral to [facility]      No orders of the defined types were placed in this encounter.   Follow-up: Return in about 6 months (around 07/22/2021) for medication recheck.   1. Iron deficiency anemia, unspecified iron deficiency anemia type He is currently not taking any iron supplementation.  He denies any blood in his stool.  I will check a hemosure today.  He is also going to have follow-up with GI because of the request by rheumatology.  2. Hepatitis B core antibody positive Referral to GI per request of rheumatology.  3. Plaque psoriasis He will follow up with his specialist on this as soon as he gets the okay to start a biologic agent.  4. Dementia without behavioral disturbance, unspecified dementia type ([place]) This is stable, he seems to be at baseline.  It appears that he is taking Aricept 5 mg.  Currently living in a memory care facility.  5. Essential hypertension Blood pressure is stable.  6. Chronic obstructive pulmonary disease, unspecified COPD type ([place]) COPD is listed on his past medical history but all I see on his medication list at this time is an albuterol inhaler.  He denies having any problems with his breathing.  7. Personal history of prostate cancer Apparently this was treated with radiation therapy.  I would need to look back on his records from Dr. Caryn Section for more information.  This visit occurred during the SARS-CoV-2 public health emergency.  Safety protocols were in place, including screening questions prior to the visit, additional usage of staff PPE, and extensive cleaning of exam room while observing appropriate contact time as indicated for disinfecting solutions.   Tramain Gershman M  Rowland Ericsson, PA-C

## 2021-01-22 ENCOUNTER — Telehealth: Payer: Self-pay | Admitting: *Deleted

## 2021-01-22 NOTE — Chronic Care Management (AMB) (Signed)
  Chronic Care Management   Outreach Note  01/22/2021 Name: Rajvir Ernster MRN: [id number] DOB: 1946-08-22  Demitris Pokorny is a 75 y.o. year old male who is a primary care patient of Allwardt, [name], PA-C. I reached out to M[place] Red by phone today in response to a referral sent by Mr. Malakie Bergeman's PCP,   Allwardt, [name], PA-C.   An unsuccessful telephone outreach was attempted today. The patient was referred to the case management team for assistance with care management and care coordination.   Follow Up Plan: A HIPAA compliant phone message was left for the patient providing contact information and requesting a return call. The care management team will reach out to the patient again over the next 7 days. If patient returns call to provider office, please advise to call [facility] at [phone].  [facility]

## 2021-01-22 NOTE — Chronic Care Management (AMB) (Addendum)
  Chronic Care Management   Note  01/22/2021 Name: Carl Obrien MRN: [id number] DOB: 09/05/1946  Carl Obrien is a 75 y.o. year old male who is a primary care patient of Carl Obrien, Carl Evens, PA-C. I reached out to Carl Obrien by phone today in response to a referral sent by Carl Obrien's PCP, Carl Obrien, Carl Evens, PA-C.  Carl Obrien was given information about Chronic Care Management services today including:  1. CCM service includes personalized support from designated clinical staff supervised by his physician, including individualized plan of care and coordination with other care providers 2. 24/7 contact phone numbers for assistance for urgent and routine care needs. 3. Service will only be billed when office clinical staff spend 20 minutes or more in a month to coordinate care. 4. Only one practitioner may furnish and bill the service in a calendar month. 5. The patient may stop CCM services at any time (effective at the end of the month) by phone call to the office staff. 6. The patient will be responsible for cost sharing (co-pay) of up to 20% of the service fee (after annual deductible is met).  confirmed by Sister in law Carl Obrien DPR on file verbally agreed to assistance and services provided by embedded care coordination/care management team today.  Follow up plan: Telephone appointment with care management team member scheduled for:01/30/2021  [facility]

## 2021-01-23 ENCOUNTER — Other Ambulatory Visit (INDEPENDENT_AMBULATORY_CARE_PROVIDER_SITE_OTHER): Payer: Medicare HMO

## 2021-01-23 ENCOUNTER — Telehealth: Payer: Self-pay | Admitting: Physician Assistant

## 2021-01-23 DIAGNOSIS — D509 Iron deficiency anemia, unspecified: Secondary | ICD-10-CM

## 2021-01-23 LAB — FECAL OCCULT BLOOD, IMMUNOCHEMICAL: Fecal Occult Bld: POSITIVE — AB

## 2021-01-23 NOTE — Telephone Encounter (Signed)
CRITICAL VALUE STICKER  CRITICAL VALUE: POS IFOB  RECEIVER (on-site recipient of call):Makailey Hodgkin  DATE & TIME NOTIFIED:01/23/2021 11:20 am  MESSENGER (representative from lab): Cristal Ford [facility]  MD NOTIFIED: Artelia Laroche Allwardt  TIME OF NOTIFICATION: 11:24 am  RESPONSE:

## 2021-01-29 ENCOUNTER — Other Ambulatory Visit (INDEPENDENT_AMBULATORY_CARE_PROVIDER_SITE_OTHER): Payer: Medicare HMO

## 2021-01-29 ENCOUNTER — Telehealth: Payer: Self-pay

## 2021-01-29 ENCOUNTER — Ambulatory Visit (INDEPENDENT_AMBULATORY_CARE_PROVIDER_SITE_OTHER): Payer: Medicare HMO | Admitting: Gastroenterology

## 2021-01-29 ENCOUNTER — Encounter: Payer: Self-pay | Admitting: Gastroenterology

## 2021-01-29 VITALS — BP 112/70 | HR 70 | Ht 68.0 in | Wt 147.8 lb

## 2021-01-29 DIAGNOSIS — R768 Other specified abnormal immunological findings in serum: Secondary | ICD-10-CM | POA: Diagnosis not present

## 2021-01-29 DIAGNOSIS — L400 Psoriasis vulgaris: Secondary | ICD-10-CM

## 2021-01-29 DIAGNOSIS — Z86010 Personal history of colonic polyps: Secondary | ICD-10-CM

## 2021-01-29 DIAGNOSIS — D649 Anemia, unspecified: Secondary | ICD-10-CM

## 2021-01-29 DIAGNOSIS — R195 Other fecal abnormalities: Secondary | ICD-10-CM

## 2021-01-29 LAB — CBC WITH DIFFERENTIAL/PLATELET
Basophils Absolute: 0 10*3/uL (ref 0.0–0.1)
Basophils Relative: 0.7 % (ref 0.0–3.0)
Eosinophils Absolute: 0.1 10*3/uL (ref 0.0–0.7)
Eosinophils Relative: 1.5 % (ref 0.0–5.0)
HCT: 38.4 % — ABNORMAL LOW (ref 39.0–52.0)
Hemoglobin: 13 g/dL (ref 13.0–17.0)
Lymphocytes Relative: 24.2 % (ref 12.0–46.0)
Lymphs Abs: 1.5 10*3/uL (ref 0.7–4.0)
MCHC: 33.9 g/dL (ref 30.0–36.0)
MCV: 90.1 fl (ref 78.0–100.0)
Monocytes Absolute: 0.6 10*3/uL (ref 0.1–1.0)
Monocytes Relative: 9.3 % (ref 3.0–12.0)
Neutro Abs: 4.1 10*3/uL (ref 1.4–7.7)
Neutrophils Relative %: 64.3 % (ref 43.0–77.0)
Platelets: 193 10*3/uL (ref 150.0–400.0)
RBC: 4.26 Mil/uL (ref 4.22–5.81)
RDW: 16.4 % — ABNORMAL HIGH (ref 11.5–15.5)
WBC: 6.3 10*3/uL (ref 4.0–10.5)

## 2021-01-29 LAB — TSH: TSH: 2.8 u[IU]/mL (ref 0.35–4.50)

## 2021-01-29 MED ORDER — SUTAB 1479-225-188 MG PO TABS: 1.0000 | ORAL_TABLET | Freq: Once | ORAL | 0 refills | Status: AC

## 2021-01-29 MED ORDER — SUTAB 1479-225-188 MG PO TABS: 1.0000 | ORAL_TABLET | Freq: Once | ORAL | 0 refills | Status: DC

## 2021-01-29 NOTE — Patient Instructions (Addendum)
If you are age 75 or older, your body mass index should be between 23-30. Your Body mass index is 22.47 kg/m. If this is out of the aforementioned range listed, please consider follow up with your Primary Care Provider.  If you are age 74 or younger, your body mass index should be between 19-25. Your Body mass index is 22.47 kg/m. If this is out of the aformentioned range listed, please consider follow up with your Primary Care Provider.   DISCONTINUE MOBIC (MELOXICAM), ASPIRIN AND CELECOXIB (CELEBREX)   You have been scheduled for an endoscopy and colonoscopy. Please follow the written instructions given to you at your visit today. Please pick up your prep supplies at the pharmacy within the next 1-3 days. If you use inhalers (even only as needed), please bring them with you on the day of your procedure.    Please go to the lab in the basement of our building to have lab work done as you leave today. Hit "B" for basement when you get on the elevator.  When the doors open the lab is on your left.  We will call you with the results. Thank you.  Due to recent changes in healthcare laws, you may see the results of your imaging and laboratory studies on MyChart before your provider has had a chance to review them.  We understand that in some cases there may be results that are confusing or concerning to you. Not all laboratory results come back in the same time frame and the provider may be waiting for multiple results in order to interpret others.  Please give [place] 48 hours in order for your provider to thoroughly review all the results before contacting the office for clarification of your results.     Thank you for entrusting me with your care and for choosing [facility], Dr. Rose Farm Cellar

## 2021-01-29 NOTE — Telephone Encounter (Signed)
[facility], at [phone] and spoke to nurse. Advised her that patient needs to discontinue mobic (meloxicam), Aspirin and Celebrex. Nurse requested that we fax the changes.  Faxed orders to [phone].

## 2021-01-29 NOTE — Progress Notes (Signed)
HPI :  75 year old male with a history of dementia, COPD, history of prostate cancer status post XRT, referred by Alyssa Allwardt PA-C for anemia and history of hepatitis B exposure.   The patient is accompanied by his sister-in-law Loletta Specter today.  The patient has been anemic over the past year with hemoglobin ranging from 9-12.  More recently in November 2021, hemoglobin was 9.4, MCV 92.6.  There is report of iron deficiency diagnosed however I do not see that in the records.  In February of this year, roughly 1 month ago hemoglobin was 13.6.  He denies any blood in his stools that he can see.  He had a positive fecal occult blood test on March 8.  Patient states he submitted a brown stool for that specimen.  Patient lives in the [place] [facility] care [facility].  He is unaware of the medications he takes.  It does not appear that he was taking an iron supplement.  On review of his medications he is taking a regular aspirin 325 mg twice daily.  He is taking Celebrex 100 mg twice daily, as well as meloxicam 7.5 mg a day.  He states he was prescribed this for hip pain although had a hip surgery recently and states his hip pain is much better.  He denies any pain at this time.  He does take pantoprazole 40 mg a day.  He denies any abdominal pains that bother him.  He eats well.  No dysphagia.  No nausea or vomiting.  Denies seeing any dark stools or melena.  He thinks his paternal grandmother may have had colon cancer, not sure, otherwise no other strong family history of colon cancer or stomach cancer.  He had a colonoscopy last done in June 2017, had a 9 mm cecal polyp removed and told to repeat in 5 years.  He last had an upper endoscopy in 2013.  No reports of these are available.  He denies any cardiopulmonary symptoms.  He denies any problems with anesthesia in the past.  He does not think he has had any problems with his bowel since he is reexperience radiation for his prostate cancer.  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  His rheumatologist does want to start him on Skyrizi (IL-23 inhibitor).  As part of this work-up he had serologic testing and was found to have a positive hepatitis B core antibody.  He has a negative hepatitis B surface antigen.  He has a positive hepatitis B surface antibody.  Negative hepatitis C antibody.  LFTs are normal, ALT is 16.  He denies ever having a known history of hepatitis B.  Prior workup: Colonoscopy 05/14/2016 - diverticulosis, 77mm cecal polyp - told to repeat in 5 years Colonoscopy 09/17/2011 - diverticulosis, lymphocytic colitis? EGD 02/11/2012 - "biopsies no evidence of H pylori"   CBC Latest Ref Rng & Units 01/03/2021 09/25/2020 09/24/2020  WBC 3.8 - 10.8 Thousand/uL 6.5 10.3 11.2(H)  Hemoglobin 13.2 - 17.1 g/dL 13.6 9.4(L) 10.7(L)  Hematocrit 38.5 - 50.0 % 40.5 28.7(L) 32.4(L)  Platelets 140 - 400 Thousand/uL 224 186 207     Past Medical History:  Diagnosis Date  . Cancer [facility])    prostate, s/p seed implant  . COPD (chronic obstructive pulmonary disease) ([place])   . Dementia ([place])   . Dementia ([place])   . GERD (gastroesophageal reflux disease)   . History of alcoholism ([place])   . Hyperlipidemia   . Hypertension  Past Surgical History:  Procedure Laterality Date  . INSERTION PROSTATE RADIATION SEED    . 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 Liver cancer Mother   . Colon cancer Neg Hx   . Stomach cancer Neg Hx   . Esophageal cancer Neg Hx   . Pancreatic cancer Neg Hx    Social History   Tobacco Use  . Smoking status: Former Smoker    Types: Cigarettes  . Smokeless tobacco: Never Used  . Tobacco comment: years ago  Vaping Use  . Vaping Use: Never used  Substance Use Topics  . Alcohol use: Not  Currently    Comment: quit Feb 2021  . Drug use: Never   Current Outpatient Medications  Medication Sig Dispense Refill  . acetaminophen (TYLENOL) 500 MG tablet Take 500 mg by mouth at bedtime.    Marland Kitchen albuterol (VENTOLIN HFA) 108 (90 Base) MCG/ACT inhaler Inhale 2 puffs into the lungs 2 (two) times daily as needed for wheezing or shortness of breath.     . allopurinol (ZYLOPRIM) 100 MG tablet Take 100 mg by mouth daily. (0900)    . aspirin EC 325 MG EC tablet Take 1 tablet (325 mg total) by mouth 2 (two) times daily after a meal. 60 tablet 0  . atorvastatin (LIPITOR) 40 MG tablet Take 40 mg by mouth at bedtime. (2000)    . azelastine (ASTELIN) 0.1 % nasal spray Place 1 spray into both nostrils 2 (two) times daily. Use in each nostril as directed 30 mL 12  . betamethasone dipropionate (DIPROLENE) 0.05 % ointment Apply to affected area daily after bathing (Patient taking differently: Apply 1 application topically daily.) 50 g 0  . celecoxib (CELEBREX) 100 MG capsule Take 100 mg by mouth 2 (two) times daily.    . cloNIDine (CATAPRES) 0.1 MG tablet Take 0.1 mg by mouth every 8 (eight) hours as needed (hypertension.).     Marland Kitchen docusate sodium (COLACE) 100 MG capsule Take 1 capsule (100 mg total) by mouth 2 (two) times daily as needed (for constipation). 30 capsule 0  . donepezil (ARICEPT) 5 MG tablet Take 5 mg by mouth at bedtime. (2000)    . fluticasone (FLONASE) 50 MCG/ACT nasal spray Place 1 spray into both nostrils 2 (two) times daily. (0900 & 1600)    . hydrochlorothiazide (HYDRODIURIL) 25 MG tablet Take 25 mg by mouth daily. (0900)    . latanoprost (XALATAN) 0.005 % ophthalmic solution Place 1 drop into both eyes at bedtime.     Marland Kitchen loratadine (CLARITIN) 10 MG tablet Take 10 mg by mouth daily.    Marland Kitchen losartan (COZAAR) 50 MG tablet Take 50 mg by mouth 2 (two) times daily. (0900 & 1600)    . meloxicam (MOBIC) 7.5 MG tablet Take 7.5 mg by mouth daily. (0900)    . montelukast (SINGULAIR) 10 MG tablet Take  10 mg by mouth daily. (0900)    . oxyCODONE (OXY IR/ROXICODONE) 5 MG immediate release tablet Take 5 mg by mouth in the morning and at bedtime.    . pantoprazole (PROTONIX) 40 MG tablet Take 40 mg by mouth daily at 6 (six) AM. (0600)    . sertraline (ZOLOFT) 25 MG tablet Take 25 mg by mouth at bedtime. (2000)    . thiamine 100 MG tablet Take 100  mg by mouth daily. (0900)    . timolol (BETIMOL) 0.5 % ophthalmic solution Place 1 drop into both eyes 2 (two) times daily. (0900 & 1600)    . tiZANidine (ZANAFLEX) 4 MG tablet Take 1 tablet (4 mg total) by mouth every 8 (eight) hours as needed for muscle spasms. 60 tablet 0  . triamcinolone (KENALOG) 0.1 % Apply 1 application topically daily.    . vitamin B-12 (CYANOCOBALAMIN) 1000 MCG tablet Take 1,000 mcg by mouth daily. (0900)    . zolpidem (AMBIEN) 5 MG tablet Take 5 mg by mouth at bedtime.     No current facility-administered medications for this visit.   Allergies  Allergen Reactions  . Ciprofloxacin Other (See Comments)    unknown     Review of Systems: All systems reviewed and negative except where noted in HPI.   CBC Latest Ref Rng & Units 01/03/2021 09/25/2020 09/24/2020  WBC 3.8 - 10.8 Thousand/uL 6.5 10.3 11.2(H)  Hemoglobin 13.2 - 17.1 g/dL 13.6 9.4(L) 10.7(L)  Hematocrit 38.5 - 50.0 % 40.5 28.7(L) 32.4(L)  Platelets 140 - 400 Thousand/uL 224 186 207    Lab Results  Component Value Date   CREATININE 1.40 (H) 01/03/2021   BUN 24 01/03/2021   NA 140 01/03/2021   K 4.1 01/03/2021   CL 105 01/03/2021   CO2 25 01/03/2021    Lab Results  Component Value Date   ALT 16 01/03/2021   AST 17 01/03/2021   ALKPHOS 91 09/22/2020   BILITOT 0.4 01/03/2021    No results found for: IRON, TIBC, FERRITIN    Physical Exam: BP 112/70   Pulse 70   Ht 5\' 8"  (1.727 m)   Wt 147 lb 12.8 oz (67 kg)   SpO2 98%   BMI 22.47 kg/m  Constitutional: Pleasant,well-developed, male in no acute distress. HEENT: Normocephalic and atraumatic.  Conjunctivae are normal. No scleral icterus. Neck supple.  Cardiovascular: Normal rate, regular rhythm.  Pulmonary/chest: Effort normal and breath sounds normal. No wheezing, rales or rhonchi. Abdominal: Soft, nondistended, nontender. There are no masses palpable.  Extremities: no edema Lymphadenopathy: No cervical adenopathy noted. Neurological: Alert and oriented to person place and time. Skin: Skin is warm and dry. No rashes noted. Psychiatric: Normal mood and affect. Behavior is normal.   ASSESSMENT AND PLAN: 75 year old male here for new patient assessment of the following:  Anemia Heme positive stool History of colon polyps NSAID use Hepatitis B core antibody positive / psoriasis  Discussed the findings with the patient and his sister-in-law [name]. he had a progressive anemia with a hemoglobin of 9.4 back in November however repeat in February was 13.6.  He had heme positive stool at that point time.  Otherwise no overt bleeding.  He is due for surveillance colonoscopy for history of colon polyps.  There is report of history of iron deficiency but I do not see any iron studies in his file today.  Discussed the situation with the patient and sister-in-law.  We reached out to [facility] to confirm his medications that he is receiving on a daily basis.  It appears he is taking regular aspirin twice daily, Celebrex twice daily, Mobic once daily for joint pains.  Very high risk for peptic ulcer disease and NSAID related bleeding/erosions on this regimen.  He denies any pain at this time, also denies any history of stroke or heart attack.  I recommend he stop all NSAIDs right now while we sort this out.  I will touch base with  his primary care physician to see if there is any reason for him to be on this amount of NSAID otherwise.  We will send him to the lab to repeat CBC, iron studies, B12/folate, TSH.  Ultimately recommend endoscopy and colonoscopy to further evaluate his history of anemia and  colon polyps as well as positive stool test.  I discussed risks and benefits of endoscopy and colonoscopy with him and anesthesia and they wanted proceed.  He will continue his Protonix for now.  I will check a hepatitis B DNA level to further risk stratify his risk for hep B activation in the setting of immunosuppression and then relay recommendations about potential biologic therapy for psoriatic arthritis.  Hopefully risk is low and this is can just be simply monitored as he starts a lot of therapy.  Patient and sister-in-law in agreement with the plan.  We will touch base with Ann at phone [phone] with results. They agreed.  Plan: - lab today - CBC, TIBC / ferritin. B12 / folate, TSH - NSAIDs - daily aspirin BID, celebrex BID, mobic BID - continue protonix - EGD and colonoscopy  - hepatitis B DNA level  Further recommendations pending results and course.   [place] Cellar, MD [facility]  CC: Allwardt, [name], PA-C

## 2021-01-30 ENCOUNTER — Ambulatory Visit (INDEPENDENT_AMBULATORY_CARE_PROVIDER_SITE_OTHER): Payer: Medicare HMO | Admitting: *Deleted

## 2021-01-30 DIAGNOSIS — F0390 Unspecified dementia without behavioral disturbance: Secondary | ICD-10-CM | POA: Diagnosis not present

## 2021-01-30 DIAGNOSIS — L400 Psoriasis vulgaris: Secondary | ICD-10-CM

## 2021-01-30 LAB — B12 AND FOLATE PANEL
Folate: 13.1 ng/mL (ref >5.9)
Vitamin B-12: 1191 pg/mL — ABNORMAL HIGH (ref 211–911)

## 2021-01-30 LAB — IBC + FERRITIN
Ferritin: 106.7 ng/mL (ref 22.0–322.0)
Iron: 52 ug/dL (ref 42–165)
Saturation Ratios: 15 % — ABNORMAL LOW (ref 20.0–50.0)
Transferrin: 248 mg/dL (ref 212.0–360.0)

## 2021-01-30 NOTE — Patient Instructions (Signed)
Visit Information  PATIENT GOALS:  Goals Addressed            This Visit's Progress   . Family request to review Medication List       Timeframe:  Short-Term Goal Priority:  High Start Date:   01/30/21                          Expected End Date:   03/17/21                    Follow Up Date 02/20/21    . Make a list of people who can help and what they can do  . Determine who will provider primary care for patient Games developer or [facility]) . Review list of medications with CCM Pharmacist . Attend scheduled medical appointments with patient   Why is this important?    Learning that you or your loved one has dementia can be scary and stressful.   You can reduce stress by planning.   Preparing for the future is one of the most important things to do.   Thinking about how much care you/your loved one will need and how much it will cost is not easy.   Early on, you/your loved one can be part of making decisions for the future.   Making sure that your/your loved one's wishes for care are known is important.     Notes:        Consent to CCM Services: Carl Obrien was given information about Chronic Care Management services today including:  1. CCM service includes personalized support from designated clinical staff supervised by his physician, including individualized plan of care and coordination with other care providers 2. 24/7 contact phone numbers for assistance for urgent and routine care needs. 3. Service will only be billed when office clinical staff spend 20 minutes or more in a month to coordinate care. 4. Only one practitioner may furnish and bill the service in a calendar month. 5. The patient may stop CCM services at any time (effective at the end of the month) by phone call to the office staff. 6. The patient will be responsible for cost sharing (co-pay) of up to 20% of the service fee (after annual deductible is met).  Patient agreed to services and verbal consent  obtained.   The patient verbalized understanding of instructions, educational materials, and care plan provided today and declined offer to receive copy of patient instructions, educational materials, and care plan.   The care management team will reach out to the patient again over the next 30 business days to verify medication list reviewed with CCM Pharmacist.   Carl Azure RN, MSN RN Care Management Coordinator  [place] [phone] [email or web address]   CLINICAL CARE PLAN: Patient Care Plan: Care Coordination  Problem Identified: Sister in Carl Obrien has questions about medication list   Priority: High  Goal: Sister in law will review medication list with CCM Pharmacist within the next 30 days   Start Date: 01/30/2021  Expected End Date: 03/17/2021  Priority: High  Current Barriers:  . Care Coordination needs related to medication list review in a patient with history of dementia residing at [facility].  Patient resident of [place].  Sister in [place], Carl Obrien is requesting someone at [facility] office review patient's medication list from his ALF.   Carl Obrien is happy with care at facility  and states patient is doing well, just concerned about the medications patient is receiving.  Attended appointment with Carl Obrien yesterday and was noted to be on several NSAID's with history of Carl Obrien Bleed.  Patient's care had been provided by facilities provider, Carl Blitz NP, who sees patients on Wednesdays.  Spoke with Carl Obrien, Administrator with [facility], who was not aware patient and family was seeking primary care with another provider.  Updated Carl Obrien at [facility] patient had visit with PA at [facility] on 01/20/2020.  Carl Obrien and I spoke with sister in law and discussed they needed to decide who was going to provide primary care services due to insurance billing.  Per Carl Obrien, sister in law, they  would prefer patient receive/establish primary care with Carl Obrien at [facility].  Sister in law has list of medications from facility and would like to review with CCM Pharmacist.  Patient is needing Carl Obrien clearance for treatment of his psoriasis and scheduled for Colonoscopy/Endoscopy May 2022. Marland Kitchen Unable to independently care for self and manage medications . Unable to self administer medications as prescribed . Unable to perform ADLs independently . Unable to perform IADLs independently Nurse Case Manager Clinical Goal(s):  Marland Kitchen Patient's sister in law will work with CM team pharmacist to review list of medications from ALF Interventions:  . 1:1 collaboration with Allwardt, [name], PA-C regarding development and update of comprehensive plan of care as evidenced by provider attestation and co-signature . Inter-disciplinary care team collaboration (see longitudinal plan of care) . Collaborated with Carl Obrien [phone] or [phone]) Administrator at [place] regarding patient medication list; requested list be faxed to [facility] office . Pharmacy referral for [place] referral for medication review with Sister in [place] . Spoke with Carl Obrien at [facility] office and requested once medication list received via fax to request one of the nurses scan into electronic chart . Discussed with sister in law need for patient and family to decide who will provide primary care for patient (facility versus providers at [facility]) . Caregiver support and counseling provided . Resources needed to meet goals identified . Encouraged sister in law to contact facility to discuss patient's care and discussed the importance of staying in close contact with staff at facility . Confirmed with sister in law no immediate care needs or questions Patient Goals/Self-Care Activities Over the next 30 days, patient will: Marland Kitchen Make a list of people who can help and what they can do  . Determine who will provider primary care for  patient Games developer or [facility]) . Review list of medications with CCM Pharmacist . Attend scheduled medical appointments with patient Follow Up Plan: The care management team will reach out to the patient again over the next 30 business days to verify review of medications with CCM Pharmacist.

## 2021-01-30 NOTE — Chronic Care Management (AMB) (Signed)
Chronic Care Management   CCM RN Visit Note  01/30/2021 Name: Carl Obrien MRN: [id number] DOB: [birth year]  Subjective: Carl Obrien is a 75 y.o. year old male who is a primary care patient of Allwardt, Alyssa M, PA-C. The care management team was consulted for assistance with disease management and care coordination needs.    Engaged with patient by telephone for initial visit in response to provider referral for case management and/or care coordination services.   Consent to Services:  The patient was given the following information about Chronic Care Management services today, agreed to services, and gave verbal consent: 1. CCM service includes personalized support from designated clinical staff supervised by the primary care provider, including individualized plan of care and coordination with other care providers 2. 24/7 contact phone numbers for assistance for urgent and routine care needs. 3. Service will only be billed when office clinical staff spend 20 minutes or more in a month to coordinate care. 4. Only one practitioner may furnish and bill the service in a calendar month. 5.The patient may stop CCM services at any time (effective at the end of the month) by phone call to the office staff. 6. The patient will be responsible for cost sharing (co-pay) of up to 20% of the service fee (after annual deductible is met). Patient agreed to services and consent obtained.  Patient agreed to services and verbal consent obtained.   Assessment: Review of patient past medical history, allergies, medications, health status, including review of consultants reports, laboratory and other test data, was performed as part of comprehensive evaluation and provision of chronic care management services.   SDOH (Social Determinants of Health) assessments and interventions performed:  SDOH Interventions   Flowsheet Row Most Recent Value  SDOH Interventions   Food Insecurity Interventions Intervention  Not Indicated  Housing Interventions Intervention Not Indicated  Transportation Interventions Intervention Not Indicated       CCM Care Plan  Allergies  Allergen Reactions  . Ciprofloxacin Other (See Comments)    unknown    Outpatient Encounter Medications as of 01/30/2021  Medication Sig  . acetaminophen (TYLENOL) 500 MG tablet Take 500 mg by mouth at bedtime.  Marland Kitchen albuterol (VENTOLIN HFA) 108 (90 Base) MCG/ACT inhaler Inhale 2 puffs into the lungs 2 (two) times daily as needed for wheezing or shortness of breath.   . allopurinol (ZYLOPRIM) 100 MG tablet Take 100 mg by mouth daily. (0900)  . atorvastatin (LIPITOR) 40 MG tablet Take 40 mg by mouth at bedtime. (2000)  . azelastine (ASTELIN) 0.1 % nasal spray Place 1 spray into both nostrils 2 (two) times daily. Use in each nostril as directed  . betamethasone dipropionate (DIPROLENE) 0.05 % ointment Apply to affected area daily after bathing (Patient taking differently: Apply 1 application topically daily.)  . cloNIDine (CATAPRES) 0.1 MG tablet Take 0.1 mg by mouth every 8 (eight) hours as needed (hypertension.).   Marland Kitchen docusate sodium (COLACE) 100 MG capsule Take 1 capsule (100 mg total) by mouth 2 (two) times daily as needed (for constipation).  Marland Kitchen donepezil (ARICEPT) 5 MG tablet Take 5 mg by mouth at bedtime. (2000)  . fluticasone (FLONASE) 50 MCG/ACT nasal spray Place 1 spray into both nostrils 2 (two) times daily. (0900 & 1600)  . hydrochlorothiazide (HYDRODIURIL) 25 MG tablet Take 25 mg by mouth daily. (0900)  . Hydrocortisone (SCALPICIN MAXIMUM STRENGTH EX) Apply 1 application topically 4 (four) times daily as needed.  . latanoprost (XALATAN) 0.005 % ophthalmic solution Place 1 drop  into both eyes at bedtime.   Marland Kitchen loratadine (CLARITIN) 10 MG tablet Take 10 mg by mouth daily.  Marland Kitchen losartan (COZAAR) 50 MG tablet Take 50 mg by mouth 2 (two) times daily. (0900 & 1600)  . montelukast (SINGULAIR) 10 MG tablet Take 10 mg by mouth daily. (0900)   . oxyCODONE (OXY IR/ROXICODONE) 5 MG immediate release tablet Take 5 mg by mouth in the morning and at bedtime.  Marland Kitchen oxyCODONE-acetaminophen (PERCOCET/ROXICET) 5-325 MG tablet Take 1 tablet by mouth every 6 (six) hours as needed for severe pain.  . pantoprazole (PROTONIX) 40 MG tablet Take 40 mg by mouth daily at 6 (six) AM. (0600)  . sertraline (ZOLOFT) 25 MG tablet Take 25 mg by mouth at bedtime. (2000)  . Sunscreens (COPPERTONE SPORT SPF 30 EX) Apply 1 application topically every 2 (two) hours as needed (while in the sun).  Marland Kitchen thiamine 100 MG tablet Take 100 mg by mouth daily. (0900)  . timolol (BETIMOL) 0.5 % ophthalmic solution Place 1 drop into both eyes 2 (two) times daily. (0900 & 1600)  . tiZANidine (ZANAFLEX) 4 MG tablet Take 1 tablet (4 mg total) by mouth every 8 (eight) hours as needed for muscle spasms.  Marland Kitchen triamcinolone (KENALOG) 0.1 % Apply 1 application topically daily.  . vitamin B-12 (CYANOCOBALAMIN) 1000 MCG tablet Take 1,000 mcg by mouth daily. (0900)  . zolpidem (AMBIEN) 5 MG tablet Take 5 mg by mouth at bedtime.   No facility-administered encounter medications on file as of 01/30/2021.    Patient Active Problem List   Diagnosis Date Noted  . Essential hypertension 01/19/2021  . Dementia without behavioral disturbance ([place]) 01/19/2021  . Plaque psoriasis 01/19/2021  . Chronic obstructive pulmonary disease ([place]) 01/19/2021  . Personal history of prostate cancer 01/19/2021  . Primary osteoarthritis of right hip 09/22/2020  . Status post total hip replacement, right 09/22/2020    Conditions to be addressed/monitored:Medication List Review and Dementia  Care Plan : Care Coordination  Updates made by Leona Singleton, RN since 01/30/2021 12:00 AM  Problem: Sister in [place] has questions about medication list   Priority: High  Goal: Sister in law will review medication list with CCM Pharmacist within the next 30 days   Start Date: 01/30/2021  Expected End Date: 03/17/2021   Priority: High  Current Barriers:  . Care Coordination needs related to medication list review in a patient with history of dementia residing at [facility].  Patient resident of [place].  Sister in [place], [name] is requesting someone at [facility] office review patient's medication list from his ALF.   [name] is happy with care at facility and states patient is doing well, just concerned about the medications patient is receiving.  Attended appointment with GI yesterday and was noted to be on several NSAID's with history of GI Bleed.  Patient's care had been provided by facilities provider, [name], who sees patients on Wednesdays.  Spoke with [name], Administrator with [facility], who was not aware patient and family was seeking primary care with another provider.  Updated [name] at [facility] patient had visit with PA at [facility] on 01/20/2020.  [name] and I spoke with sister in law and discussed they needed to decide who was going to provide primary care services due to insurance billing.  Per [name], sister in law, they would prefer patient receive/establish primary care with [facility].  Sister  in law has list of medications from facility and would like to review with CCM Pharmacist.  Patient is needing GI clearance for treatment of his psoriasis and scheduled for Colonoscopy/Endoscopy May 2022. Marland Kitchen Unable to independently care for self and manage medications . Unable to self administer medications as prescribed . Unable to perform ADLs independently . Unable to perform IADLs independently Nurse Case Manager Clinical Goal(s):  Marland Kitchen Patient's sister in law will work with CM team pharmacist to review list of medications from ALF Interventions:  . 1:1 collaboration with Allwardt, [name], PA-C regarding development and update of comprehensive plan of care as evidenced by provider attestation and  co-signature . Inter-disciplinary care team collaboration (see longitudinal plan of care) . Collaborated with [name] [phone] or [phone]) Administrator at [place] regarding patient medication list; requested list be faxed to [facility] office . Pharmacy referral for [facility] referral for medication review with Sister in [place] . Spoke with [name] at [facility] office and requested once medication list received via fax to request one of the nurses scan into electronic chart . Discussed with sister in law need for patient and family to decide who will provide primary care for patient (facility versus providers at [facility]) . Caregiver support and counseling provided . Resources needed to meet goals identified . Encouraged sister in law to contact facility to discuss patient's care and discussed the importance of staying in close contact with staff at facility . Confirmed with sister in law no immediate care needs or questions Patient Goals/Self-Care Activities Over the next 30 days, patient will: Marland Kitchen Make a list of people who can help and what they can do  . Determine who will provider primary care for patient Games developer or [facility]) . Review list of medications with CCM Pharmacist . Attend scheduled medical appointments with patient Follow Up Plan: The care management team will reach out to the patient again over the next 30 business days to verify review of medications with CCM Pharmacist.      Plan:The care management team will reach out to the patient again over the next 30 business days to verify medication list has been reviewed with CCM Pharmacist.  Hubert Azure RN, MSN RN Care Management Coordinator  [facility] [phone] [email or web address]

## 2021-01-31 ENCOUNTER — Telehealth: Payer: Self-pay

## 2021-01-31 ENCOUNTER — Ambulatory Visit: Payer: Self-pay

## 2021-01-31 NOTE — Chronic Care Management (AMB) (Signed)
Chronic Care Management Pharmacy Assistant   Name: Carl Obrien  MRN: [id number] DOB: June 24, 1946  Carl Obrien is an 75 y.o. year old male who presents for his initial CCM visit with the clinical pharmacist.  Reason for Encounter: Medication Review   Conditions to be addressed/monitored: HTN, COPD, Dementia and Plaque psoriasis and osteoarthritis of right hip  Recent office visits:  01/19/2021 OV PCP Alyssa Allwardt PA-C; positive IFOB in office; referral to GI, no medication changes indicated.  Recent consult visits:  01/03/2021 OV (dermatology) Dr. Denna Haggard; Will start paperwork for [employer].  Obtain baseline labs.  Increase strength of topical to betamethasone debris and 8 pending approval of biologic.  Hospital visits:  None in previous 6 months  Medications: Outpatient Encounter Medications as of 01/31/2021  Medication Sig  . acetaminophen (TYLENOL) 500 MG tablet Take 500 mg by mouth at bedtime.  Marland Kitchen albuterol (VENTOLIN HFA) 108 (90 Base) MCG/ACT inhaler Inhale 2 puffs into the lungs 2 (two) times daily as needed for wheezing or shortness of breath.   . allopurinol (ZYLOPRIM) 100 MG tablet Take 100 mg by mouth daily. (0900)  . atorvastatin (LIPITOR) 40 MG tablet Take 40 mg by mouth at bedtime. (2000)  . azelastine (ASTELIN) 0.1 % nasal spray Place 1 spray into both nostrils 2 (two) times daily. Use in each nostril as directed  . betamethasone dipropionate (DIPROLENE) 0.05 % ointment Apply to affected area daily after bathing (Patient taking differently: Apply 1 application topically daily.)  . cloNIDine (CATAPRES) 0.1 MG tablet Take 0.1 mg by mouth every 8 (eight) hours as needed (hypertension.).   Marland Kitchen docusate sodium (COLACE) 100 MG capsule Take 1 capsule (100 mg total) by mouth 2 (two) times daily as needed (for constipation).  Marland Kitchen donepezil (ARICEPT) 5 MG tablet Take 5 mg by mouth at bedtime. (2000)  . fluticasone (FLONASE) 50 MCG/ACT nasal spray Place 1 spray into both  nostrils 2 (two) times daily. (0900 & 1600)  . hydrochlorothiazide (HYDRODIURIL) 25 MG tablet Take 25 mg by mouth daily. (0900)  . Hydrocortisone (SCALPICIN MAXIMUM STRENGTH EX) Apply 1 application topically 4 (four) times daily as needed.  . latanoprost (XALATAN) 0.005 % ophthalmic solution Place 1 drop into both eyes at bedtime.   Marland Kitchen loratadine (CLARITIN) 10 MG tablet Take 10 mg by mouth daily.  Marland Kitchen losartan (COZAAR) 50 MG tablet Take 50 mg by mouth 2 (two) times daily. (0900 & 1600)  . montelukast (SINGULAIR) 10 MG tablet Take 10 mg by mouth daily. (0900)  . oxyCODONE (OXY IR/ROXICODONE) 5 MG immediate release tablet Take 5 mg by mouth in the morning and at bedtime.  Marland Kitchen oxyCODONE-acetaminophen (PERCOCET/ROXICET) 5-325 MG tablet Take 1 tablet by mouth every 6 (six) hours as needed for severe pain.  . pantoprazole (PROTONIX) 40 MG tablet Take 40 mg by mouth daily at 6 (six) AM. (0600)  . sertraline (ZOLOFT) 25 MG tablet Take 25 mg by mouth at bedtime. (2000)  . Sunscreens (COPPERTONE SPORT SPF 30 EX) Apply 1 application topically every 2 (two) hours as needed (while in the sun).  Marland Kitchen thiamine 100 MG tablet Take 100 mg by mouth daily. (0900)  . timolol (BETIMOL) 0.5 % ophthalmic solution Place 1 drop into both eyes 2 (two) times daily. (0900 & 1600)  . tiZANidine (ZANAFLEX) 4 MG tablet Take 1 tablet (4 mg total) by mouth every 8 (eight) hours as needed for muscle spasms.  Marland Kitchen triamcinolone (KENALOG) 0.1 % Apply 1 application topically daily.  . vitamin B-12 (  CYANOCOBALAMIN) 1000 MCG tablet Take 1,000 mcg by mouth daily. (0900)  . zolpidem (AMBIEN) 5 MG tablet Take 5 mg by mouth at bedtime.   No facility-administered encounter medications on file as of 01/31/2021.    Any changes in your medications or health? Patient's sister in-law states the patient hasn't had any changes but has some concerns. Patient has plaque psoriasis. She states "it's aggressive and worsening" and has had it for some time.  Patient wants to start on a biologic for his plaque psoriasis. She states he needs to find out if he has any internal bleeding as he recently had a positive fecal occult test. She states he has colonoscopy scheduled for May 4th 2022.   She states they have discontinued using Aspirin, Celebrex and meloxicam. She states he takes tylenol and oxycodone for pain. She would like to discuss any alternative to the NSAID's?  She also states the patient had hip replacement surgery 09/2020 which was very successful.  Any side effects from any medications?  Not that they are aware of.  Do you have any symptoms or problems not managed by your medications? Patient's sister in law states the patient has a chronic runny nose. He is currently taking montelukast and also uses nasal spray without much relief.  Any concerns about your health right now? Patient's sister in law states the patient seems to be doing rather well, other than itching from psoriasis which annoys him a lot.  Has your provider asked that you check blood pressure, blood sugar, or follow special diet at home? Patient's sister in law states the patient is currently living in a memory care facility at [facility] on [address]. She states she is not sure if they check his blood pressure or blood sugars at this time. She states she will ask and have an update at the time of their appointment with Madelin Rear, CPP. She states the patient is "on a regular diet".  Do you get any type of exercise on a regular basis? Patient's sister in law states the patient like to go for walks, but he is not organized about it.  Can you think of a goal you would like to reach for your health? She states the patient would like to stop itching and help his runny nose.  Do you have any problems getting your medications? She states they are having a hard stop and starting the patient on a Biologic for his psoriasis. She states other than that they have no  difficulty getting medications as he lives in a memory care facility which has a good arrangement with his medications.  Is there anything that you would like to discuss during the appointment?  She states she would like to discuss the patients medications.  Please bring medications and supplements to appointment  Patient's sister in law scheduled initial visit as an in person visit on 02/07/2021 at 10:30 am.  April D Calhoun, [place] Pharmacist Assistant [phone]

## 2021-01-31 NOTE — Progress Notes (Unsigned)
Chronic Care Management Pharmacy Note  01/31/2021 Name:  Carl Obrien MRN:  [id number] DOB:  1946/01/14  Subjective: Carl Obrien is an 75 y.o. year old male who is a primary patient of Allwardt, Alyssa M, PA-C.  The CCM team was consulted for assistance with disease management and care coordination needs.    {CCMTELEPHONEFACETOFACE:21091510} for {CCMINITIALFOLLOWUPCHOICE:21091511} in response to provider referral for pharmacy case management and/or care coordination services.   Consent to Services:  {CCMCONSENTOPTIONS:25074}  Patient Care Team: Allwardt, [name], PA-C as PCP - General (Physician Assistant) Lavonna Monarch, MD as Consulting Physician (Dermatology) Leona Singleton, RN as Case Manager Madelin Rear, [facility] (Pharmacist)  Recent office visits: 01/19/2021 (PCP): Please go to the lab for a Hemosure test. I will send referral to GI and they should call about this to schedule. I am also scheduling you with CCM for medication management. They will contact you about appointment. I will see you back in 6 months for medication recheck.  Recent consult visits: 01/29/2021 (Dr Lorina Rabon): workup for [employer]. Very high risk for peptic ulcer disease and NSAID related bleeding/erosions on this regimen.  He denies any pain at this time, also denies any history of stroke or heart attack.  I recommend he stop all NSAIDs right now while we sort this out.  I will touch base with his primary care physician to see if there is any reason for him to be on this amount of NSAID otherwise.  We will send him to the lab to repeat CBC, iron studies, B12/folate, TSH.  Ultimately recommend endoscopy and colonoscopy to further evaluate his history of anemia and colon polyps as well as positive stool test.  I discussed risks and benefits of endoscopy and colonoscopy with him and anesthesia and they wanted proceed.  He will continue his Protonix for now.  I will check a hepatitis B DNA level to further risk  stratify his risk for hep B activation in the setting of immunosuppression and then relay recommendations about potential biologic therapy for psoriatic arthritis.  Hopefully risk is low and this is can just be simply monitored as he starts a lot of therapy.  Patient and sister-in-law in agreement with the plan.  We will touch base with Ann at phone [phone] with results. They agreed.  [place], at [phone] and spoke to nurse. Advised her that patient needs to discontinue mobic (meloxicam), Aspirin and Celebrex. Nurse requested that we fax the changes.  Faxed orders to [phone].   Next appt 03/31/2021 01/03/2021 (Dr Denna Haggard): Plaque psoriasis. Will start paperwork for [employer].  Obtain baseline labs.  Increase strength of topical to betamethasone debris and 8 pending approval of biologic.  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 He will need to be cleared by an internist or gastroenterologist before he can proceed with a biologic. 04/05/2020 (Dr Leta Baptist): Mild-moderate dementia (since 2019).  h/o alcohol abuse (abstinent since March 2021; now at memory care unit)- continue donepezil Objective: Lab Results  Component Value Date   CREATININE 1.40 (H) 01/03/2021   CREATININE 1.14 09/22/2020   CREATININE 1.76 (H) 07/19/2020   GFR 37.99 (L) 07/19/2020   GFRNONAA >60 09/22/2020  Last diabetic Eye exam: No results found for: HMDIABEYEEXA  Last diabetic Foot exam: No results found for: HMDIABFOOTEX  Lab Results  Component Value Date   CHOL 225 (A) 11/17/2019   TRIG 102 11/17/2019   HDL 69 11/17/2019  [place] 138 11/17/2019   Hepatic Function Latest Ref Rng & Units 01/03/2021 09/22/2020 07/19/2020  Total Protein 6.1 - 8.1 g/dL 6.4 6.7 6.1  Albumin 3.5 - 5.0 g/dL - 4.1 4.3  AST 10 - 35 U/L _0 ALT 9 - 46 U/L _1 Alk Phosphatase 38 - 126 U/L - 91 81  Total  Bilirubin 0.2 - 1.2 mg/dL 0.4 0.5 0.5   Lab Results  Component Value Date/Time   TSH 2.80 01/29/2021 04:45 PM   TSH 1.67 10/06/2019 12:00 AM   CBC Latest Ref Rng & Units 01/29/2021 01/03/2021 09/25/2020  WBC 4.0 - 10.5 K/uL 6.3 6.5 10.3  Hemoglobin 13.0 - 17.0 g/dL 13.0 13.6 9.4(L)  Hematocrit 39.0 - 52.0 % 38.4(L) 40.5 28.7(L)  Platelets 150.0 - 400.0 K/uL 193.0 224 186   No results found for: VD25OH  Clinical ASCVD: {YES/NO:21197} The 10-year ASCVD risk score Mikey Bussing DC Jr., et al., 2013) is: 21.7%*   Values used to calculate the score:     Age: 81 years     Sex: Male     Is Non-Hispanic African American: No     Diabetic: No     Tobacco smoker: No     Systolic Blood Pressure: 321 mmHg     Is BP treated: Yes     HDL Cholesterol: 69 mg/dL*     Total Cholesterol: 225 mg/dL*     * - Cholesterol units were assumed for this score calculation    Other: ***  Social History   Tobacco Use  Smoking Status Former Smoker  . Types: Cigarettes  Smokeless Tobacco Never Used  Tobacco Comment   years ago   BP Readings from Last 3 Encounters:  01/29/21 112/70  01/19/21 114/70  09/25/20 140/72   Pulse Readings from Last 3 Encounters:  01/29/21 70  01/19/21 65  09/25/20 62   Wt Readings from Last 3 Encounters:  01/29/21 147 lb 12.8 oz (67 kg)  01/19/21 149 lb 3.2 oz (67.7 kg)  09/22/20 141 lb 6.4 oz (64.1 kg)    Assessment: Review of patient past medical history, allergies, medications, health status, including review of consultants reports, laboratory and other test data, was performed as part of comprehensive evaluation and provision of chronic care management services.   SDOH:  (Social Determinants of Health) assessments and interventions performed:   CCM Care Plan Allergies  Allergen Reactions  . Ciprofloxacin Other (See Comments)    unknown   Medications Reviewed Today    Reviewed by Leona Singleton, RN (Registered Nurse) on 01/30/21 at 1358  Med List Status: <None>   Medication Order Taking? Sig Documenting Provider Last Dose Status Informant  acetaminophen (TYLENOL) 500 MG tablet [id number] No Take 500 mg by mouth at bedtime. [provider] Taking Active   albuterol (VENTOLIN HFA) 108 (90 Base) MCG/ACT inhaler [id number] No Inhale 2 puffs into the lungs 2 (two) times daily as needed for wheezing or shortness of breath.  [provider] Taking Active Nursing Home Medication Administration Guide (MAG)  allopurinol (ZYLOPRIM) 100 MG tablet [id number] No Take 100 mg by mouth daily. (0900) [provider] Taking Active Nursing Home Medication Administration Guide (MAG)  atorvastatin (LIPITOR) 40 MG tablet [id number] No Take 40 mg by mouth at bedtime. (2000) [provider] Taking Active Nursing Home Medication Administration Guide (MAG)  azelastine (ASTELIN) 0.1 % nasal spray [id number] No Place 1 spray into both nostrils 2 (two) times daily. Use in each nostril as  directed Brunetta Jeans, PA-C Taking Active   betamethasone dipropionate (DIPROLENE) 0.05 % ointment [id number] No Apply to affected area daily after bathing  Patient taking differently: Apply 1 application topically daily.   Lavonna Monarch, MD Taking Active   cloNIDine (CATAPRES) 0.1 MG tablet [id number] No Take 0.1 mg by mouth every 8 (eight) hours as needed (hypertension.).  [provider] Taking Active Nursing Home Medication Administration Guide (MAG)  docusate sodium (COLACE) 100 MG capsule [id number] No Take 1 capsule (100 mg total) by mouth 2 (two) times daily as needed (for constipation). Dereck Leep, PA-C Taking Active   donepezil (ARICEPT) 5 MG tablet [id number] No Take 5 mg by mouth at bedtime. (2000) [provider] Taking Active Nursing Home Medication Administration Guide (MAG)  fluticasone (FLONASE) 50 MCG/ACT nasal spray [id number] No Place 1 spray into both nostrils 2 (two) times daily. (0900 & 1600) [provider] Taking  Active Nursing Home Medication Administration Guide (MAG)  hydrochlorothiazide (HYDRODIURIL) 25 MG tablet [id number] No Take 25 mg by mouth daily. (0900) [provider] Taking Active Nursing Home Medication Administration Guide (MAG)  Hydrocortisone (SCALPICIN MAXIMUM STRENGTH EX) [id number] No Apply 1 application topically 4 (four) times daily as needed. [provider] Taking Active   latanoprost (XALATAN) 0.005 % ophthalmic solution [id number] No Place 1 drop into both eyes at bedtime.  [provider] Taking Active Nursing Home Medication Administration Guide (MAG)  loratadine (CLARITIN) 10 MG tablet [id number] No Take 10 mg by mouth daily. [provider] Taking Active Nursing Home Medication Administration Guide (MAG)  losartan (COZAAR) 50 MG tablet [id number] No Take 50 mg by mouth 2 (two) times daily. (0900 & 1600) [provider] Taking Active Nursing Home Medication Administration Guide (MAG)  montelukast (SINGULAIR) 10 MG tablet [id number] No Take 10 mg by mouth daily. (0900) [provider] Taking Active Nursing Home Medication Administration Guide (MAG)  oxyCODONE (OXY IR/ROXICODONE) 5 MG immediate release tablet [id number] No Take 5 mg by mouth in the morning and at bedtime. [provider] Taking Active Nursing Home Medication Administration Guide (MAG)  oxyCODONE-acetaminophen (PERCOCET/ROXICET) 5-325 MG tablet [id number] No Take 1 tablet by mouth every 6 (six) hours as needed for severe pain. [provider] Taking Active   pantoprazole (PROTONIX) 40 MG tablet [id number] No Take 40 mg by mouth daily at 6 (six) AM. (0600) [provider] Taking Active Nursing Home Medication Administration Guide (MAG)  sertraline (ZOLOFT) 25 MG tablet [id number] No Take 25 mg by mouth at bedtime. (2000) [provider] Taking Active Nursing Home Medication Administration Guide (MAG)  Sunscreens (COPPERTONE SPORT SPF 30 EX)  [id number] No Apply 1 application topically every 2 (two) hours as needed (while in the sun). [provider] Taking Active   thiamine 100 MG tablet [id number] No Take 100 mg by mouth daily. (0900) [provider] Taking Active Nursing Home Medication Administration Guide (MAG)  timolol (BETIMOL) 0.5 % ophthalmic solution [id number] No Place 1 drop into both eyes 2 (two) times daily. (0900 & 1600) [provider] Taking Active Nursing Home Medication Administration Guide (MAG)  tiZANidine (ZANAFLEX) 4 MG tablet [id number] No Take 1 tablet (4 mg total) by mouth every 8 (eight) hours as needed for muscle spasms. Dereck Leep, PA-C Taking Active   triamcinolone (KENALOG) 0.1 % [id number] No Apply 1 application topically daily. [provider] Taking Active   vitamin B-12 (CYANOCOBALAMIN) 1000 MCG tablet [id number] No Take 1,000 mcg by mouth daily. (0900) [provider] Taking Active Nursing Home Medication Administration Guide (MAG)  zolpidem (AMBIEN) 5 MG tablet [id number] No Take 5 mg by mouth at bedtime. [provider] Taking Active   Med List Note Marylynn Pearson 09/08/20 1326): Patient is a resident of [facility] [phone] OR [phone] OR [phone]         Patient Active Problem List   Diagnosis Date Noted  . Essential hypertension 01/19/2021  . Dementia without behavioral disturbance ([place]) 01/19/2021  . Plaque psoriasis 01/19/2021  . Chronic obstructive pulmonary disease ([place]) 01/19/2021  . Personal history of prostate cancer 01/19/2021  . Primary osteoarthritis of right hip 09/22/2020  . Status post total hip replacement, right 09/22/2020   Immunization History  Administered Date(s) Administered  . Pneumococcal Conjugate-13 12/24/2011, 12/22/2015  . Pneumococcal Polysaccharide-23 05/23/2011  . Tdap 05/23/2011  . Zoster 11/26/2011    Conditions to be addressed/monitored: {CCM ASSESSMENT DISEASE  OPTIONS:25047}  There are no care plans that you recently modified to display for this patient.  Current Barriers:  . {pharmacybarriers:24917} . ***  Pharmacist Clinical Goal(s):  Marland Kitchen Over the next *** days, patient will {PHARMACYGOALCHOICES:24921} through collaboration with PharmD and provider.  . ***  Interventions: . 1:1 collaboration with Allwardt, [name], PA-C regarding development and update of comprehensive plan of care as evidenced by provider attestation and co-signature . Inter-disciplinary care team collaboration (see longitudinal plan of care) . Comprehensive medication review performed; medication list updated in electronic medical record  Hypertension (BP goal <140/90) -Controlled -Current treatment: . Clonidine 0.1 mg TID  . HCTZ 25 mg once daily . Losartan 50 mg twice daily  -Medications previously tried: ***  -Current home readings: *** -Current dietary habits: *** -Current exercise habits: *** -{ACTIONS;DENIES/REPORTS:21021675::"Denies"} hypotensive/hypertensive symptoms -Educated on {CCM BP Counseling:25124} -Counseled to monitor BP at home ***, document, and provide log at future appointments -{CCMPHARMDINTERVENTION:25122}  Hyperlipidemia: (LDL goal < ***) -{US controlled/uncontrolled:25276} -Current treatment: . *** -Medications previously tried: ***  -Current dietary patterns: *** -Current exercise habits: *** -Educated on {CCM HLD Counseling:25126} -{CCMPHARMDINTERVENTION:25122}  COPD (Goal: control symptoms and prevent exacerbations) -{US controlled/uncontrolled:25276} -Current treatment  . *** -Medications previously tried: ***  -Gold Grade: {CHL HP Upstream Pharm COPD Gold DJTTS:[id number]} -Current COPD Classification:  {CHL HP Upstream Pharm COPD [phone]} -MMRC/CAT score: *** -Pulmonary function testing: *** -Exacerbations requiring treatment in last 6 months: *** -Patient {Actions; denies-reports:120008} consistent use  of maintenance inhaler -Frequency of rescue inhaler use: *** -Counseled on {CCMINHALERCOUNSELING:25121} -{CCMPHARMDINTERVENTION:25122}  *** (Goal: ***) -{US controlled/uncontrolled:25276} -Current treatment  . *** -Medications previously tried: ***  -{CCMPHARMDINTERVENTION:25122}  Health Maintenance -Vaccine gaps: *** -Current therapy:  . *** -Educated on {ccm supplement counseling:25128} -{CCM Patient satisfied:25129} -{CCMPHARMDINTERVENTION:25122}   Patient Goals/Self-Care Activities . Over the next *** days, patient will:  - {pharmacypatientgoals:24919}  Medication Assistance: {MEDASSISTANCEINFO:25044}  Patient's preferred pharmacy is:  [facility] [place], [place] - [address] [place] [address] Phone: [phone] Fax: [phone]  Uses pill box? {Yes or If no, why not?:20788}. Pt endorses ***% compliance  Follow Up:  {FOLLOWUP:24991} Plan: {CM FOLLOW UP JGOT:15726} Future Appointments  Date Time Provider [place]  02/20/2021 10:00 AM [facility]  03/21/2021  1:30 PM Armbruster, Carlota Raspberry, MD [facility]  07/24/2021 11:00 AM Allwardt, [name], PA-C [facility]    Madelin Rear, Pharm.D., BCGP Clinical Pharmacist [place] [phone]

## 2021-01-31 NOTE — Chronic Care Management (AMB) (Signed)
  Chronic Care Management   Outreach Note  01/31/2021 Name: Carl Obrien MRN: [id number] DOB: [birth year]  Referred by: Allwardt, [name], PA-C Reason for referral: telephone visit [facility] clinical pharmacist, Madelin Rear.   An unsuccessful telephone outreach was attempted today. The patient was referred to the pharmacist for assistance with care management and care coordination. If patient returns call, please provide number below to reschedule visit.   Madelin Rear, Pharm.D., BCGP Clinical Pharmacist [place] [phone]

## 2021-02-01 LAB — HEPATITIS B DNA, ULTRAQUANTITATIVE, PCR
Hepatitis B DNA (Calc): <1 Log IU/mL
Hepatitis B DNA: <10 IU/mL

## 2021-02-06 ENCOUNTER — Other Ambulatory Visit: Payer: Self-pay

## 2021-02-06 DIAGNOSIS — L400 Psoriasis vulgaris: Secondary | ICD-10-CM

## 2021-02-06 DIAGNOSIS — R195 Other fecal abnormalities: Secondary | ICD-10-CM

## 2021-02-06 DIAGNOSIS — R768 Other specified abnormal immunological findings in serum: Secondary | ICD-10-CM

## 2021-02-06 DIAGNOSIS — Z86010 Personal history of colonic polyps: Secondary | ICD-10-CM

## 2021-02-06 DIAGNOSIS — D649 Anemia, unspecified: Secondary | ICD-10-CM

## 2021-02-06 NOTE — Patient Instructions (Signed)
Carl Obrien,  Thank you for taking the time to review your medications with me today.  I have included our care plan/goals in the following pages. Please review and call me at [phone] with any questions!  Thanks! Carl Obrien, Pharm.D., BCGP Clinical Pharmacist [facility] [place] [phone] Patient Care Plan: Care Coordination    Problem Identified: Sister in Carl Obrien has questions about medication list   Priority: High    Goal: Sister in law will review medication list with CCM Pharmacist within the next 30 days   Start Date: 01/30/2021  Expected End Date: 03/17/2021  Priority: High  Note:   Current Barriers:  . Care Coordination needs related to medication list review in a patient with history of dementia residing at [facility].  Patient resident of [facility].  Sister in [place], Carl Obrien is requesting someone at [facility] office review patient's medication list from his ALF.   Carl Obrien is happy with care at facility and states patient is doing well, just concerned about the medications patient is receiving.  Attended appointment with GI yesterday and was noted to be on several NSAID's with history of GI Bleed.  Patient's care had been provided by facilities provider, Carl Blitz NP, who sees patients on Wednesdays.  Spoke with Carl Obrien, Administrator with [facility], who was not aware patient and family was seeking primary care with another provider.  Updated Carl Obrien at [facility] patient had visit with PA at [facility] on 01/20/2020.  Carl Obrien and I spoke with sister in law and discussed they needed to decide who was going to provide primary care services due to insurance billing.  Per Carl Obrien, sister in law, they would prefer patient receive/establish primary care with [place] at [facility]  Sister in law has list of medications from facility and would like to  review with CCM Pharmacist.  Patient is needing GI clearance for treatment of his psoriasis and scheduled for Colonoscopy/Endoscopy May 2022. Marland Kitchen Unable to independently care for self and manage medications . Unable to self administer medications as prescribed . Unable to perform ADLs independently . Unable to perform IADLs independently Nurse Case Manager Clinical Goal(s):  Marland Kitchen Patient's sister in law will work with CM team pharmacist to review list of medications from ALF Interventions:  . 1:1 collaboration with Carl Obrien, Carl Evens, PA-C regarding development and update of comprehensive plan of care as evidenced by provider attestation and co-signature . Inter-disciplinary care team collaboration (see longitudinal plan of care) . Collaborated with Carl Obrien [phone] or [phone]) Administrator at [facility] regarding patient medication list; requested list be faxed to [facility] office . Pharmacy referral for [facility] referral for medication review with Sister in [place] . Spoke with Carl Obrien at [facility] office and requested once medication list received via fax to request one of the nurses scan into electronic chart . Discussed with sister in law need for patient and family to decide who will provide primary care for patient (facility versus providers at [facility]) . Caregiver support and counseling provided . Resources needed to meet goals identified . Encouraged sister in law to contact facility to discuss patient's care and discussed the importance of staying in close contact with staff at facility . Confirmed with sister in law no immediate care needs or questions Patient Goals/Self-Care Activities Over the next 30 days, patient will: Marland Kitchen Make a list of people who can  help and what they can do  . Determine who will provider primary care for patient Games developer or [facility]) . Review list of medications with CCM Pharmacist . Attend scheduled medical appointments with patient Follow Up Plan: The  care management team will reach out to the patient again over the next 30 business days to verify review of medications with CCM Pharmacist.             The patient verbalized understanding of instructions provided today and agreed to receive a mailed copy of patient instruction and/or educational materials. Telephone follow up appointment with pharmacy team member scheduled for: See next appointment with "Care Management Staff" under "What's Next" below.  Hypertension, Adult High blood pressure (hypertension) is when the force of blood pumping through the arteries is too strong. The arteries are the blood vessels that carry blood from the heart throughout the body. Hypertension forces the heart to work harder to pump blood and may cause arteries to become narrow or stiff. Untreated or uncontrolled hypertension can cause a heart attack, heart failure, a stroke, kidney disease, and other problems. A blood pressure reading consists of a higher number over a lower number. Ideally, your blood pressure should be below 120/80. The first ("top") number is called the systolic pressure. It is a measure of the pressure in your arteries as your heart beats. The second ("bottom") number is called the diastolic pressure. It is a measure of the pressure in your arteries as the heart relaxes. What are the causes? The exact cause of this condition is not known. There are some conditions that result in or are related to high blood pressure. What increases the risk? Some risk factors for high blood pressure are under your control. The following factors may make you more likely to develop this condition:  Smoking.  Having type 2 diabetes mellitus, high cholesterol, or both.  Not getting enough exercise or physical activity.  Being overweight.  Having too much fat, sugar, calories, or salt (sodium) in your diet.  Drinking too much alcohol. Some risk factors for high blood pressure may be difficult or  impossible to change. Some of these factors include:  Having chronic kidney disease.  Having a family history of high blood pressure.  Age. Risk increases with age.  Race. You may be at higher risk if you are African American.  Gender. Men are at higher risk than women before age 59. After age 103, women are at higher risk than men.  Having obstructive sleep apnea.  Stress. What are the signs or symptoms? High blood pressure may not cause symptoms. Very high blood pressure (hypertensive crisis) may cause:  Headache.  Anxiety.  Shortness of breath.  Nosebleed.  Nausea and vomiting.  Vision changes.  Severe chest pain.  Seizures. How is this diagnosed? This condition is diagnosed by measuring your blood pressure while you are seated, with your arm resting on a flat surface, your legs uncrossed, and your feet flat on the floor. The cuff of the blood pressure monitor will be placed directly against the skin of your upper arm at the level of your heart. It should be measured at least twice using the same arm. Certain conditions can cause a difference in blood pressure between your right and left arms. Certain factors can cause blood pressure readings to be lower or higher than normal for a short period of time:  When your blood pressure is higher when you are in a health care provider's office than  when you are at home, this is called white coat hypertension. Most people with this condition do not need medicines.  When your blood pressure is higher at home than when you are in a health care provider's office, this is called masked hypertension. Most people with this condition may need medicines to control blood pressure. If you have a high blood pressure reading during one visit or you have normal blood pressure with other risk factors, you may be asked to:  Return on a different day to have your blood pressure checked again.  Monitor your blood pressure at home for 1 week or  longer. If you are diagnosed with hypertension, you may have other blood or imaging tests to help your health care provider understand your overall risk for other conditions. How is this treated? This condition is treated by making healthy lifestyle changes, such as eating healthy foods, exercising more, and reducing your alcohol intake. Your health care provider may prescribe medicine if lifestyle changes are not enough to get your blood pressure under control, and if:  Your systolic blood pressure is above 130.  Your diastolic blood pressure is above 80. Your personal target blood pressure may vary depending on your medical conditions, your age, and other factors. Follow these instructions at home: Eating and drinking  Eat a diet that is high in fiber and potassium, and low in sodium, added sugar, and fat. An example eating plan is called the DASH (Dietary Approaches to Stop Hypertension) diet. To eat this way: ? Eat plenty of fresh fruits and vegetables. Try to fill one half of your plate at each meal with fruits and vegetables. ? Eat whole grains, such as whole-wheat pasta, brown rice, or whole-grain bread. Fill about one fourth of your plate with whole grains. ? Eat or drink low-fat dairy products, such as skim milk or low-fat yogurt. ? Avoid fatty cuts of meat, processed or cured meats, and poultry with skin. Fill about one fourth of your plate with lean proteins, such as fish, chicken without skin, beans, eggs, or tofu. ? Avoid pre-made and processed foods. These tend to be higher in sodium, added sugar, and fat.  Reduce your daily sodium intake. Most people with hypertension should eat less than 1,500 mg of sodium a day.  Do not drink alcohol if: ? Your health care provider tells you not to drink. ? You are pregnant, may be pregnant, or are planning to become pregnant.  If you drink alcohol: ? Limit how much you use to:  0-1 drink a day for women.  0-2 drinks a day for  men. ? Be aware of how much alcohol is in your drink. In the [place] one drink equals one 12 oz bottle of beer (355 mL), one 5 oz glass of wine (148 mL), or one 1 oz glass of hard liquor (44 mL).   Lifestyle  Work with your health care provider to maintain a healthy body weight or to lose weight. Ask what an ideal weight is for you.  Get at least 30 minutes of exercise most days of the week. Activities may include walking, swimming, or biking.  Include exercise to strengthen your muscles (resistance exercise), such as Pilates or lifting weights, as part of your weekly exercise routine. Try to do these types of exercises for 30 minutes at least 3 days a week.  Do not use any products that contain nicotine or tobacco, such as cigarettes, e-cigarettes, and chewing tobacco. If you need help quitting, ask  your health care provider.  Monitor your blood pressure at home as told by your health care provider.  Keep all follow-up visits as told by your health care provider. This is important.   Medicines  Take over-the-counter and prescription medicines only as told by your health care provider. Follow directions carefully. Blood pressure medicines must be taken as prescribed.  Do not skip doses of blood pressure medicine. Doing this puts you at risk for problems and can make the medicine less effective.  Ask your health care provider about side effects or reactions to medicines that you should watch for. Contact a health care provider if you:  Think you are having a reaction to a medicine you are taking.  Have headaches that keep coming back (recurring).  Feel dizzy.  Have swelling in your ankles.  Have trouble with your vision. Get help right away if you:  Develop a severe headache or confusion.  Have unusual weakness or numbness.  Feel faint.  Have severe pain in your chest or abdomen.  Vomit repeatedly.  Have trouble breathing. Summary  Hypertension is when the force of blood  pumping through your arteries is too strong. If this condition is not controlled, it may put you at risk for serious complications.  Your personal target blood pressure may vary depending on your medical conditions, your age, and other factors. For most people, a normal blood pressure is less than 120/80.  Hypertension is treated with lifestyle changes, medicines, or a combination of both. Lifestyle changes include losing weight, eating a healthy, low-sodium diet, exercising more, and limiting alcohol. This information is not intended to replace advice given to you by your health care provider. Make sure you discuss any questions you have with your health care provider. Document Revised: 07/15/2018 Document Reviewed: 07/15/2018 Elsevier Patient Education  2021 [employer].

## 2021-02-06 NOTE — Progress Notes (Signed)
Chronic Care Management Pharmacy Note  02/12/2021 Name:  Briceson Broadwater MRN:  [id number] DOB:  1946-10-27  Subjective: Carl Obrien is an 75 y.o. year old male who is a primary patient of Allwardt, Alyssa M, PA-C.  The CCM team was consulted for assistance with disease management and care coordination needs.  Accompanied by sister in law [place].   Engaged with patient face to face for initial visit in response to provider referral for pharmacy case management and/or care coordination services.   Consent to Services:  The patient was given the following information about Chronic Care Management services today, agreed to services, and gave verbal consent: 1. CCM service includes personalized support from designated clinical staff supervised by the primary care provider, including individualized plan of care and coordination with other care providers 2. 24/7 contact phone numbers for assistance for urgent and routine care needs. 3. Service will only be billed when office clinical staff spend 20 minutes or more in a month to coordinate care. 4. Only one practitioner may furnish and bill the service in a calendar month. 5.The patient may stop CCM services at any time (effective at the end of the month) by phone call to the office staff. 6. The patient will be responsible for cost sharing (co-pay) of up to 20% of the service fee (after annual deductible is met). Patient agreed to services and consent obtained.  Patient Care Team: Allwardt, [name], PA-C as PCP - General (Physician Assistant) Lavonna Monarch, MD as Consulting Physician (Dermatology) Leona Singleton, RN as Case Manager Madelin Rear, [facility] (Pharmacist)  Hospital visits: None in previous 6 months  Objective: Lab Results  Component Value Date   CREATININE 1.40 (H) 01/03/2021   CREATININE 1.14 09/22/2020   CREATININE 1.76 (H) 07/19/2020   GFR 37.99 (L) 07/19/2020   GFRNONAA >60 09/22/2020  Last diabetic Eye exam: No results found  for: HMDIABEYEEXA  Last diabetic Foot exam: No results found for: HMDIABFOOTEX  Lab Results  Component Value Date   CHOL 225 (A) 11/17/2019   TRIG 102 11/17/2019   HDL 69 11/17/2019   Anchor 138 11/17/2019   Hepatic Function Latest Ref Rng & Units 01/03/2021 09/22/2020 07/19/2020  Total Protein 6.1 - 8.1 g/dL 6.4 6.7 6.1  Albumin 3.5 - 5.0 g/dL - 4.1 4.3  AST 10 - 35 U/L 17 19 13   ALT 9 - 46 U/L 16 16 15   Alk Phosphatase 38 - 126 U/L - 91 81  Total Bilirubin 0.2 - 1.2 mg/dL 0.4 0.5 0.5   Lab Results  Component Value Date/Time   TSH 2.80 01/29/2021 04:45 PM   TSH 1.67 10/06/2019 12:00 AM   CBC Latest Ref Rng & Units 01/29/2021 01/03/2021 09/25/2020  WBC 4.0 - 10.5 K/uL 6.3 6.5 10.3  Hemoglobin 13.0 - 17.0 g/dL 13.0 13.6 9.4(L)  Hematocrit 39.0 - 52.0 % 38.4(L) 40.5 28.7(L)  Platelets 150.0 - 400.0 K/uL 193.0 224 186   No results found for: VD25OH  Clinical ASCVD:  The 10-year ASCVD risk score Mikey Bussing DC Jr., et al., 2013) is: 21.7%*   Values used to calculate the score:     Age: 80 years     Sex: Male     Is Non-Hispanic African American: No     Diabetic: No     Tobacco smoker: No     Systolic Blood Pressure: 518 mmHg     Is BP treated: Yes     HDL Cholesterol: 69 mg/dL*     Total Cholesterol: 225  mg/dL*     * - Cholesterol units were assumed for this score calculation    Social History   Tobacco Use  Smoking Status Former Smoker  . Types: Cigarettes  Smokeless Tobacco Never Used  Tobacco Comment   years ago   BP Readings from Last 3 Encounters:  01/29/21 112/70  01/19/21 114/70  09/25/20 140/72   Pulse Readings from Last 3 Encounters:  01/29/21 70  01/19/21 65  09/25/20 62   Wt Readings from Last 3 Encounters:  01/29/21 147 lb 12.8 oz (67 kg)  01/19/21 149 lb 3.2 oz (67.7 kg)  09/22/20 141 lb 6.4 oz (64.1 kg)   Assessment: Review of patient past medical history, allergies, medications, health status, including review of consultants reports, laboratory and  other test data, was performed as part of comprehensive evaluation and provision of chronic care management services.   SDOH:  (Social Determinants of Health) assessments and interventions performed:   CCM Care Plan Allergies  Allergen Reactions  . Ciprofloxacin Other (See Comments)    unknown   Medications Reviewed Today    Reviewed by Madelin Rear, [facility] (Pharmacist) on 02/07/21 at 1142  Med List Status: <None>  Medication Order Taking? Sig Documenting Provider Last Dose Status Informant  acetaminophen (TYLENOL) 500 MG tablet [id number]  Take 500 mg by mouth at bedtime. [provider]  Active   albuterol (VENTOLIN HFA) 108 (90 Base) MCG/ACT inhaler [id number]  Inhale 2 puffs into the lungs 2 (two) times daily as needed for wheezing or shortness of breath.  [provider]  Active Nursing Home Medication Administration Guide (MAG)  allopurinol (ZYLOPRIM) 100 MG tablet [id number] Yes Take 100 mg by mouth daily. (0900) [provider]  Active Nursing Home Medication Administration Guide (MAG)  atorvastatin (LIPITOR) 40 MG tablet [id number] Yes Take 40 mg by mouth at bedtime. (2000) [provider]  Active Nursing Home Medication Administration Guide (MAG)  azelastine (ASTELIN) 0.1 % nasal spray [id number] Yes Place 1 spray into both nostrils 2 (two) times daily. Use in each nostril as directed Delorse Limber  Active        Patient taking differently:      Discontinued 02/07/21 1051 (Patient Preference)   cloNIDine (CATAPRES) 0.1 MG tablet [id number] Yes Take 0.1 mg by mouth every 8 (eight) hours as needed (hypertension.).  [provider]  Active Nursing Home Medication Administration Guide (MAG)  docusate sodium (COLACE) 100 MG capsule [id number]  Take 1 capsule (100 mg total) by mouth 2 (two) times daily as needed (for constipation). Dereck Leep, PA-C  Active   donepezil (ARICEPT) 5 MG tablet [id number] Yes Take 5 mg by mouth at bedtime.  (2000) [provider]  Active Nursing Home Medication Administration Guide (MAG)  fluticasone (FLONASE) 50 MCG/ACT nasal spray [id number] Yes Place 1 spray into both nostrils 2 (two) times daily. (0900 & 1600) [provider]  Active Nursing Home Medication Administration Guide (MAG)  hydrochlorothiazide (HYDRODIURIL) 25 MG tablet [id number] Yes Take 25 mg by mouth daily. (0900) [provider]  Active Nursing Home Medication Administration Guide (MAG)        Discontinued 02/07/21 1051 (Patient Preference)   latanoprost (XALATAN) 0.005 % ophthalmic solution [id number]  Place 1 drop into both eyes at bedtime.  [provider]  Active Nursing Home Medication Administration Guide (MAG)  loratadine (CLARITIN) 10 MG tablet [id number] Yes Take 10 mg by mouth daily. [provider]  Active Nursing Home Medication Administration Guide (MAG)  losartan (COZAAR) 50 MG tablet [id number] Yes Take 50 mg by mouth 2 (two) times daily. (0900 & 1600) [provider]  Active Nursing Home Medication Administration Guide (MAG)  montelukast (SINGULAIR) 10 MG tablet [id number] Yes Take 10 mg by mouth daily. (0900) [provider]  Active Nursing Home Medication Administration Guide (MAG)  oxyCODONE (OXY IR/ROXICODONE) 5 MG immediate release tablet [id number]  Take 5 mg by mouth in the morning and at bedtime. [provider]  Active Nursing Home Medication Administration Guide (MAG)  oxyCODONE-acetaminophen (PERCOCET/ROXICET) 5-325 MG tablet [id number]  Take 1 tablet by mouth every 6 (six) hours as needed for severe pain. [provider]  Active   pantoprazole (PROTONIX) 40 MG tablet [id number] Yes Take 40 mg by mouth daily at 6 (six) AM. (0600) [provider]  Active Nursing Home Medication Administration Guide (MAG)  sertraline (ZOLOFT) 25 MG tablet [id number] Yes Take 25 mg by mouth at bedtime. (2000) [provider]  Active Nursing  Home Medication Administration Guide (MAG)  Sunscreens (COPPERTONE SPORT SPF 30 EX) [id number]  Apply 1 application topically every 2 (two) hours as needed (while in the sun). [provider]  Active   thiamine 100 MG tablet [id number] Yes Take 100 mg by mouth daily. (0900) [provider]  Active Nursing Home Medication Administration Guide (MAG)  timolol (BETIMOL) 0.5 % ophthalmic solution [id number] Yes Place 1 drop into both eyes 2 (two) times daily. (0900 & 1600) [provider]  Active Nursing Home Medication Administration Guide (MAG)  tiZANidine (ZANAFLEX) 4 MG tablet [id number]  Take 1 tablet (4 mg total) by mouth every 8 (eight) hours as needed for muscle spasms. Dereck Leep, PA-C  Active   triamcinolone (KENALOG) 0.1 % [id number] Yes Apply 1 application topically daily. [provider]  Active   vitamin B-12 (CYANOCOBALAMIN) 1000 MCG tablet [id number] Yes Take 1,000 mcg by mouth daily. (0900) [provider]  Active Nursing Home Medication Administration Guide (MAG)  zolpidem (AMBIEN) 5 MG tablet [id number] Yes Take 5 mg by mouth at bedtime. [provider]  Active   Med List Note Marylynn Pearson 09/08/20 1326): Patient is a resident of [facility] [phone] OR [phone] OR [phone]         Patient Active Problem List   Diagnosis Date Noted  . Essential hypertension 01/19/2021  . Dementia without behavioral disturbance ([place]) 01/19/2021  . Plaque psoriasis 01/19/2021  . Chronic obstructive pulmonary disease ([place]) 01/19/2021  . Personal history of prostate cancer 01/19/2021  . Primary osteoarthritis of right hip 09/22/2020  . Status post total hip replacement, right 09/22/2020   Immunization History  Administered Date(s) Administered  . Pneumococcal Conjugate-13 12/24/2011, 12/22/2015  . Pneumococcal Polysaccharide-23 05/23/2011  . Tdap 05/23/2011  . Zoster 11/26/2011   Conditions to be  addressed/monitored: HTN, HLD and COPD  Care Plan : [place]  Updates made by Madelin Rear, [facility] since 02/12/2021 12:00 AM    Problem: HLD HTN Osteoarthritis COPD   Priority: High    Long-Range Goal: Disease Management   Start Date: 02/07/2021  Expected End Date: 02/07/2022  This Visit's Progress: On track  Priority: High  Note:   Hypertension (BP goal <130/80) -Controlled -Current treatment: . Clonidine 0.1 mg every 8 hours as needed for pain  . Hydrochlorothiazide 25 mg once daily  . Losartan 50 mg twice daily  -Medications previously tried: unknown   -Current home readings: n/a -Current dietary habits: 'regular' diet per facility record -  Denies hypotensive/hypertensive symptoms -Educated on BP goals and benefits of medications for prevention of heart attack, stroke and kidney damage; Symptoms of hypotension and importance of maintaining adequate hydration; -Counseled to monitor BP at home, document, and provide log at future appointments -Counseled on diet and exercise extensively   Hyperlipidemia/CVD prevention: (LDL goal < 100) -Controlled -Current treatment: . Atorvastatin 40 mg once daily  -Medications previously tried: n/a  -Educated on Benefits of statin for ASCVD risk reduction; -Counseled on diet and exercise extensively  COPD/Asthma (Goal: control symptoms and prevent exacerbations) -Controlled -Current treatment  . Albuterol 2 puffs into the lungs twice daily as needed for wheezing or SOB -Medications previously tried: n/a  -Gold Grade: tbd -Pulmonary function testing: n/a -Exacerbations requiring treatment in last 6 months: none. 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 Aricept 5 mg once daily  -Medications previously tried: n/a  -Recommended to continue current medication Assessed side effects - denies any current problems  Osteoarthritis (Goal: ensure appropriate medication tx) -Controlled  -s/p total right hip replacement 09/2021 -is not sure if they have been giving him oxycodone in recent days, feels that he is not in any pain other than occasional neck pain. 0/10 at time of visit -previously on fixed oxycodone 39m + breakthrough percocet dose.   Patient Goals/Self-Care Activities . Over the next 365 days, patient will:  - take medications as prescribed  Medication Assistance: Needing assistance in coordination medications with [facility] - medication concerns clarified, list reconciled and reviewed from a med safety standpoint  Patient's preferred pharmacy is:    Current Barriers:  . needing assistance coordianting medication refills/disease management  Pharmacist Clinical Goal(s):  .Marland KitchenOver the next 365 days, patient will contact provider office for questions/concerns as evidenced notation of same in electronic health record through collaboration with PharmD and provider.   Interventions: . 1:1 collaboration with Allwardt, ARanda Evens PA-C regarding development and update of comprehensive plan of care as evidenced by provider attestation and co-signature . Inter-disciplinary care team collaboration (see longitudinal plan of care) . Comprehensive medication review performed; medication list updated in electronic medical record . Confirmed all medications with Patient/Sister in law, [place] facility medication administration record, and verbally with NSales executive   [facility] [address] [id number]:  [phone]: [phone] Follow Up:  Patient agrees to Care Plan and Follow-up. Plan: Telephone follow up appointment with care management team member scheduled for:  1-3 months or sooner to review medications. Will help patient with PAP applications as needed.   Future Appointments  Date Time Provider [place] 02/20/2021 10:00 AM [facility]  03/21/2021  1:30 PM Armbruster, SCarlota Raspberry MD [facility]-LEC L[facility]  04/18/2021 11:00 AM [facility] CCM PHARMACIST [facility] PEC  07/24/2021 11:00 AM Allwardt, ARanda Evens PA-C [facility] PEC    JMadelin Rear Pharm.D., BCGP Clinical Pharmacist [place]([phone]

## 2021-02-07 ENCOUNTER — Ambulatory Visit: Payer: Medicare HMO

## 2021-02-07 ENCOUNTER — Other Ambulatory Visit: Payer: Self-pay

## 2021-02-07 ENCOUNTER — Telehealth: Payer: Self-pay | Admitting: Gastroenterology

## 2021-02-07 DIAGNOSIS — M1611 Unilateral primary osteoarthritis, right hip: Secondary | ICD-10-CM

## 2021-02-07 DIAGNOSIS — J449 Chronic obstructive pulmonary disease, unspecified: Secondary | ICD-10-CM

## 2021-02-07 DIAGNOSIS — I10 Essential (primary) hypertension: Secondary | ICD-10-CM

## 2021-02-07 NOTE — Telephone Encounter (Signed)
Spoke with [name], see 01/29/21 result note for more information.

## 2021-02-07 NOTE — Telephone Encounter (Signed)
Lm on vm for patient to return call 

## 2021-02-08 ENCOUNTER — Telehealth: Payer: Self-pay

## 2021-02-08 NOTE — Progress Notes (Signed)
3/24- Attempted to reach out to [facility] to confirm use of oxyxodone-apap/oxycodone/tizanidine and see what medications he is truly needing refilled.  All mailboxes full, unable to get through.   3/24 - Attempted Diane and was not able to connect. Left voicemail requesting call back medication clarification.   3/24 - spoke Facilities manager. Patient was taking oxycodone 5 mg scheduled - had difficulty get renewal from ortho/previous providers. Relayed to me that he has not used for the past two weeks and that it seemed like patient's pain significantly improved following hip replacement. He does have active Rx for percocet 5-325mg  QID PRN as needed. Is not currently out of any other medications. [facility] has contact information for PCP office on file if they are needing any future medications.   [facility] Nurse Manager contact info [phone]

## 2021-02-08 NOTE — Telephone Encounter (Signed)
3/24- Attempted to reach out to [facility] to confirm use of oxyxodone-apap/oxycodone/tizanidine and see what medications he is truly needing refilled.  All mailboxes full, unable to get through.   3/24 - Attempted Diane and was not able to connect. Left voicemail requesting call back medication clarification.   3/24 - spoke Facilities manager. Patient was taking oxycodone 5 mg scheduled - had difficulty get renewal from ortho/previous providers. Relayed to me that he has not used for the past two weeks and that it seemed like patient's pain significantly improved following hip replacement. He does have active Rx for percocet 5-325mg  QID PRN as needed. Is not currently out of any other medications. [facility] has contact information for PCP office on file if they are needing any future medications. [facility] Nurse Manager contact info [phone]

## 2021-02-20 ENCOUNTER — Telehealth: Payer: Self-pay

## 2021-02-20 ENCOUNTER — Encounter: Payer: Self-pay | Admitting: Gastroenterology

## 2021-02-20 ENCOUNTER — Ambulatory Visit (INDEPENDENT_AMBULATORY_CARE_PROVIDER_SITE_OTHER): Payer: Medicare HMO | Admitting: *Deleted

## 2021-02-20 DIAGNOSIS — F0390 Unspecified dementia without behavioral disturbance: Secondary | ICD-10-CM | POA: Diagnosis not present

## 2021-02-20 DIAGNOSIS — L400 Psoriasis vulgaris: Secondary | ICD-10-CM

## 2021-02-20 NOTE — Telephone Encounter (Signed)
Thank you for helping with this and taking the time to coordinate this for the patient!

## 2021-02-20 NOTE — Patient Instructions (Signed)
Visit Information  PATIENT GOALS: Goals Addressed            This Visit's Progress   . COMPLETED: Family request to review Medication List       Timeframe:  Short-Term Goal Priority:  High Start Date:   01/30/21                          Expected End Date:   03/17/21                    Follow Up Date 02/20/21    . Make a list of people who can help and what they can do  . Determine who will provider primary care for patient Games developer or [facility]) . Review list of medications with CCM Pharmacist . Attend scheduled medical appointments with patient   Why is this important?    Learning that you or your loved one has dementia can be scary and stressful.   You can reduce stress by planning.   Preparing for the future is one of the most important things to do.   Thinking about how much care you/your loved one will need and how much it will cost is not easy.   Early on, you/your loved one can be part of making decisions for the future.   Making sure that your/your loved one's wishes for care are known is important.     Notes: has reviewed medication list with CCM pharmacist    . Make and Keep All Appointments       Timeframe:  Long-Range Goal Priority:  Medium Start Date:   02/20/21                          Expected End Date:  07/18/21                     Follow Up Date 05/24/21   . Ask family for a ride; family to attend medical appointments with you . Call to cancel if needed . Keep a calendar with appointment dates  . Schedule and attend Colonoscopy wit bowel prep/clear liquids night before   Why is this important?    Part of staying healthy is seeing the doctor for follow-up care.   If you forget your appointments, there are some things you can do to stay on track.    Notes:        The patient verbalized understanding of instructions, educational materials, and care plan provided today and declined offer to receive copy of patient instructions, educational materials,  and care plan.   The patient has been provided with contact information for the care management team and has been advised to call with any health related questions or concerns.  The care management team will reach out to the patient again over the next 90 business days.   Hubert Azure RN, MSN RN Care Management Coordinator  [place] [phone] [email or web address]

## 2021-02-20 NOTE — Telephone Encounter (Signed)
Called and spoke to patient's sister-in-law Gay Rape last night.  Patient is currently scheduled for ECL in May. Dr. Havery Moros has opening for 4-6 Wednesday at 3:00pm. [name] indicated they have not been able to get the prep [facility]) and it has not be approved yet by insurance but would be interested in a sooner procedure if we could resolve those 2 issues.  A Sutab sample has been secured for patient.  Ann contacted patient at [facility], where patient resides and instructed him to begin clear liquids in case we can get procedure pre certified in time for Wednesday procedure. This morning contacted [place]. She responded now indicating that patient is "good to go. No auth required".  Called and spoke to Garrett. She will pick up Sutab sample and new prep instructions (which have been redone and left upfront) and take to [facility] and instruct patient and staff that patient will need to be on clear liquids rest of day and tomorrow and begin prep tonight at 6pm.  She understands patient will need to arrive at 2pm tomorrow. Osvaldo Angst notified of procedure date change. [place] charge nurse notified via staff message

## 2021-02-20 NOTE — Chronic Care Management (AMB) (Signed)
Chronic Care Management   CCM RN Visit Note  02/20/2021 Name: Carl Obrien MRN: [id number] DOB: 1946-06-04  Subjective: Carl Obrien is a 75 y.o. year old male who is a primary care patient of Allwardt, Alyssa M, PA-C. The care management team was consulted for assistance with disease management and care coordination needs.    Engaged with patient by telephone for follow up visit in response to provider referral for case management and/or care coordination services.   Consent to Services:  The patient was given information about Chronic Care Management services, agreed to services, and gave verbal consent prior to initiation of services.  Please see initial visit note for detailed documentation.   Patient agreed to services and verbal consent obtained.   Assessment: Review of patient past medical history, allergies, medications, health status, including review of consultants reports, laboratory and other test data, was performed as part of comprehensive evaluation and provision of chronic care management services.   SDOH (Social Determinants of Health) assessments and interventions performed:    CCM Care Plan  Allergies  Allergen Reactions  . Ciprofloxacin Other (See Comments)    unknown    Outpatient Encounter Medications as of 02/20/2021  Medication Sig  . acetaminophen (TYLENOL) 500 MG tablet Take 500 mg by mouth at bedtime.  Marland Kitchen albuterol (VENTOLIN HFA) 108 (90 Base) MCG/ACT inhaler Inhale 2 puffs into the lungs 2 (two) times daily as needed for wheezing or shortness of breath.   . allopurinol (ZYLOPRIM) 100 MG tablet Take 100 mg by mouth daily. (0900)  . atorvastatin (LIPITOR) 40 MG tablet Take 40 mg by mouth at bedtime. (2000)  . azelastine (ASTELIN) 0.1 % nasal spray Place 1 spray into both nostrils 2 (two) times daily. Use in each nostril as directed  . cloNIDine (CATAPRES) 0.1 MG tablet Take 0.1 mg by mouth every 8 (eight) hours as needed (hypertension.).   Marland Kitchen docusate  sodium (COLACE) 100 MG capsule Take 1 capsule (100 mg total) by mouth 2 (two) times daily as needed (for constipation).  Marland Kitchen donepezil (ARICEPT) 5 MG tablet Take 5 mg by mouth at bedtime. (2000)  . fluticasone (FLONASE) 50 MCG/ACT nasal spray Place 1 spray into both nostrils 2 (two) times daily. (0900 & 1600)  . hydrochlorothiazide (HYDRODIURIL) 25 MG tablet Take 25 mg by mouth daily. (0900)  . latanoprost (XALATAN) 0.005 % ophthalmic solution Place 1 drop into both eyes at bedtime.   Marland Kitchen loratadine (CLARITIN) 10 MG tablet Take 10 mg by mouth daily.  Marland Kitchen losartan (COZAAR) 50 MG tablet Take 50 mg by mouth 2 (two) times daily. (0900 & 1600)  . montelukast (SINGULAIR) 10 MG tablet Take 10 mg by mouth daily. (0900)  . oxyCODONE (OXY IR/ROXICODONE) 5 MG immediate release tablet Take 5 mg by mouth in the morning and at bedtime.  Marland Kitchen oxyCODONE-acetaminophen (PERCOCET/ROXICET) 5-325 MG tablet Take 1 tablet by mouth every 6 (six) hours as needed for severe pain.  . pantoprazole (PROTONIX) 40 MG tablet Take 40 mg by mouth daily at 6 (six) AM. (0600)  . sertraline (ZOLOFT) 25 MG tablet Take 25 mg by mouth at bedtime. (2000)  . Sunscreens (COPPERTONE SPORT SPF 30 EX) Apply 1 application topically every 2 (two) hours as needed (while in the sun).  Marland Kitchen thiamine 100 MG tablet Take 100 mg by mouth daily. (0900)  . timolol (BETIMOL) 0.5 % ophthalmic solution Place 1 drop into both eyes 2 (two) times daily. (0900 & 1600)  . tiZANidine (ZANAFLEX) 4 MG tablet Take  1 tablet (4 mg total) by mouth every 8 (eight) hours as needed for muscle spasms.  Marland Kitchen triamcinolone (KENALOG) 0.1 % Apply 1 application topically daily.  . vitamin B-12 (CYANOCOBALAMIN) 1000 MCG tablet Take 1,000 mcg by mouth daily. (0900)  . zolpidem (AMBIEN) 5 MG tablet Take 5 mg by mouth at bedtime.   No facility-administered encounter medications on file as of 02/20/2021.    Patient Active Problem List   Diagnosis Date Noted  . Essential hypertension  01/19/2021  . Dementia without behavioral disturbance ([place]) 01/19/2021  . Plaque psoriasis 01/19/2021  . Chronic obstructive pulmonary disease ([place]) 01/19/2021  . Personal history of prostate cancer 01/19/2021  . Primary osteoarthritis of right hip 09/22/2020  . Status post total hip replacement, right 09/22/2020    Conditions to be addressed/monitored:Dementia and Plaque Psoriasis  Care Plan : Care Coordination  Updates made by Leona Singleton, RN since 02/20/2021 12:00 AM  Problem: Sister in law will review medication questions with CCM Pharmacist and patient will obtain colonoscopy within the next 90 days.   Priority: Medium  Long-Range Goal: Sister in law will review medication questions with CCM Pharmacist and patient will obtain colonoscopy within the next 90 days.   Start Date: 01/30/2021  Expected End Date: 08/17/2021  This Visit's Progress: On track  Priority: Medium  Current Barriers:  . Care Coordination needs related to medication list review in a patient with history of dementia residing at [facility].  Patient resident of [place].  Sister in [place], [name] is requesting someone at [facility] office review patient's medication list from his ALF.   Spoke with sister in Sports coach, [name].  She has spoken with CCM Pharmacist, Edison Nasuti and reviewed patient's current medication list.  Denies any further medication questions at this time.  Continues to report patient is needing GI clearance for treatment of his psoriasis medication and they are in the process of scheduling Colonoscopy (maybe this week).  Ann understands patient has to be prepped prior to procedure and will follow up with GI office.  Denies any further CCM or care coordination needs for patient, stating he is doing well at the facility; but does acknowledge she is a little overwhelmed with caring for patient and her husband who now has cancer.  Sister in law, [name] does  request follow up call in 3 months for possible future needs. . Unable to independently care for self and manage medications . Unable to self administer medications as prescribed . Unable to perform ADLs independently . Unable to perform IADLs independently Nurse Case Manager Clinical Goal(s):  Marland Kitchen Patient's sister in law,Ann will continue to work with CCM Pharmacist for medication questions and will say in communication with CCM team and staff at facility for patient future needs. Interventions:  . 1:1 collaboration with Allwardt, [name], PA-C regarding development and update of comprehensive plan of care as evidenced by provider attestation and co-signature . Inter-disciplinary care team collaboration (see longitudinal plan of care) . Encouraged sister in law to stay in close contact with staff at facility, [name] [phone] or [phone]) Administrator at [facility]  . Encouraged sister in law to continue to work with CCM Pharmacist with medication questions or concerns . Caregiver support and counseling provided . Resources needed to meet goals identified . Discussed Colonoscopy and need to verified date with GI office for prep of patient the day prior to procedure . Provided emotional support and offered  empathy to family . Discussed CCM services and confirmed with sister in law no immediate care needs or questions Patient Goals/Self-Care Activities Over the next 90 days, patient will: . Ask family for a ride; family to attend medical appointments with you . Call to cancel if needed . Keep a calendar with appointment dates  . Schedule and attend Colonoscopy wit bowel prep/clear liquids night before Follow Up Plan: The care management team will reach out to the patient again over the next 90 business days per sister in law request.  Verified [name] has RNCM contact information for any questions or concerns.       Plan:The patient has been provided with contact  information for the care management team and has been advised to call with any health related questions or concerns.  and The care management team will reach out to the patient again over the next 90 business days.   Hubert Azure RN, MSN RN Care Management Coordinator  [place] [phone] [email or web address]

## 2021-02-21 ENCOUNTER — Encounter: Payer: Self-pay | Admitting: Gastroenterology

## 2021-02-21 ENCOUNTER — Other Ambulatory Visit: Payer: Self-pay

## 2021-02-21 ENCOUNTER — Ambulatory Visit (AMBULATORY_SURGERY_CENTER): Payer: Medicare HMO | Admitting: Gastroenterology

## 2021-02-21 VITALS — BP 117/65 | HR 72 | Temp 98.0°F | Resp 19 | Ht 68.0 in | Wt 147.0 lb

## 2021-02-21 DIAGNOSIS — D120 Benign neoplasm of cecum: Secondary | ICD-10-CM | POA: Diagnosis not present

## 2021-02-21 DIAGNOSIS — K449 Diaphragmatic hernia without obstruction or gangrene: Secondary | ICD-10-CM

## 2021-02-21 DIAGNOSIS — D649 Anemia, unspecified: Secondary | ICD-10-CM | POA: Diagnosis not present

## 2021-02-21 DIAGNOSIS — K319 Disease of stomach and duodenum, unspecified: Secondary | ICD-10-CM | POA: Diagnosis not present

## 2021-02-21 DIAGNOSIS — K2970 Gastritis, unspecified, without bleeding: Secondary | ICD-10-CM

## 2021-02-21 DIAGNOSIS — R195 Other fecal abnormalities: Secondary | ICD-10-CM

## 2021-02-21 DIAGNOSIS — Z86010 Personal history of colonic polyps: Secondary | ICD-10-CM

## 2021-02-21 DIAGNOSIS — K5730 Diverticulosis of large intestine without perforation or abscess without bleeding: Secondary | ICD-10-CM

## 2021-02-21 DIAGNOSIS — D125 Benign neoplasm of sigmoid colon: Secondary | ICD-10-CM

## 2021-02-21 DIAGNOSIS — D122 Benign neoplasm of ascending colon: Secondary | ICD-10-CM

## 2021-02-21 MED ORDER — SODIUM CHLORIDE 0.9 % IV SOLN: 500.0000 mL | Freq: Once | INTRAVENOUS | Status: DC

## 2021-02-21 NOTE — Op Note (Signed)
[facility] Patient Name: Carl Obrien Procedure Date: 02/21/2021 2:43 PM MRN: [id number] Endoscopist: Remo Lipps P. Alver Leete , MD Age: 75 Referring MD:  Date of Birth: 11-04-1946 Gender: Male Account #: [id number] Procedure:                Upper GI endoscopy Indications:              history of anemia, Heme positive stool - patient                            had been on regular aspirin 325mg  twice daily,                            Celebrex 100mg  twice daily, and meloxicam 7.5mg  /                            day. This was stopped during clinic visit a few                            weeks ago. Hgb since improved. No overt bleeding,                            on protonix 40mg  / day. Medicines:                Monitored Anesthesia Care Procedure:                Pre-Anesthesia Assessment:                           - Prior to the procedure, a History and Physical                            was performed, and patient medications and                            allergies were reviewed. The patient's tolerance of                            previous anesthesia was also reviewed. The risks                            and benefits of the procedure and the sedation                            options and risks were discussed with the patient.                            All questions were answered, and informed consent                            was obtained. Prior Anticoagulants: The patient has                            taken no previous anticoagulant or antiplatelet  agents. ASA Grade Assessment: III - A patient with                            severe systemic disease. After reviewing the risks                            and benefits, the patient was deemed in                            satisfactory condition to undergo the procedure.                           After obtaining informed consent, the endoscope was                            passed under direct vision.  Throughout the                            procedure, the patient's blood pressure, pulse, and                            oxygen saturations were monitored continuously. The                            Endoscope was introduced through the mouth, and                            advanced to the second part of duodenum. The upper                            GI endoscopy was accomplished without difficulty.                            The patient tolerated the procedure well. Scope In: Scope Out: Findings:                 Esophagogastric landmarks were identified: the                            Z-line was found at 37 cm, the gastroesophageal                            junction was found at 37 cm and the upper extent of                            the gastric folds was found at 40 cm from the                            incisors.                           A 3 cm hiatal hernia was present.  The exam of the esophagus was otherwise normal.                           Patchy mildly erythematous mucosa was found in the                            gastric antrum. No focal ulcerations or erosions                            noted.                           The exam of the stomach was otherwise normal.                           Biopsies were taken with a cold forceps in the                            gastric body, at the incisura and in the gastric                            antrum for Helicobacter pylori testing.                           The duodenal bulb and second portion of the                            duodenum were normal. Complications:            No immediate complications. Estimated blood loss:                            Minimal. Estimated Blood Loss:     Estimated blood loss was minimal. Impression:               - Esophagogastric landmarks identified.                           - 3 cm hiatal hernia.                           - Normal stomach otherwise                            - Erythematous mucosa in the antrum.                           - Normal stomach otherwise - biopsies taken to rule                            out H pylori                           - Normal duodenal bulb and second portion of the  duodenum.                           Overall, no clear cause of prior anemia / reported                            iron deficiency on this exam, although previously                            the patient was on a significant amount of NSAIDs                            and was since taken off. Hgb now stable. NSAID use                            previously could have been related to anemia. Recommendation:           - Patient has a contact number available for                            emergencies. The signs and symptoms of potential                            delayed complications were discussed with the                            patient. Return to normal activities tomorrow.                            Written discharge instructions were provided to the                            patient.                           - Resume previous diet.                           - Continue present medications.                           - Avoid NSAIDs                           - Await pathology results.                           - Trend Hgb for now, patient denies any overt                            bleeding Carl Obrien P. Dajanique Robley, MD 02/21/2021 3:38:02 PM This report has been signed electronically.

## 2021-02-21 NOTE — Op Note (Signed)
[facility] Patient Name: Carl Obrien Procedure Date: 02/21/2021 2:42 PM MRN: [id number] Endoscopist: Remo Lipps P. Havery Moros , MD Age: 75 Referring MD:  Date of Birth: 11/24/1945 Gender: Male Account #: [id number] Procedure:                Colonoscopy Indications:              Heme positive stool, anemia, history of adenoma                            removed in 2017 Medicines:                Monitored Anesthesia Care Procedure:                Pre-Anesthesia Assessment:                           - Prior to the procedure, a History and Physical                            was performed, and patient medications and                            allergies were reviewed. The patient's tolerance of                            previous anesthesia was also reviewed. The risks                            and benefits of the procedure and the sedation                            options and risks were discussed with the patient.                            All questions were answered, and informed consent                            was obtained. Prior Anticoagulants: The patient has                            taken no previous anticoagulant or antiplatelet                            agents. ASA Grade Assessment: III - A patient with                            severe systemic disease. After reviewing the risks                            and benefits, the patient was deemed in                            satisfactory condition to undergo the procedure.  After obtaining informed consent, the colonoscope                            was passed under direct vision. Throughout the                            procedure, the patient's blood pressure, pulse, and                            oxygen saturations were monitored continuously. The                            Endoscope was introduced through the anus and                            advanced to the the terminal ileum, with                             identification of the appendiceal orifice and IC                            valve. The colonoscopy was performed without                            difficulty. The patient tolerated the procedure                            well. The quality of the bowel preparation was                            good. The terminal ileum, ileocecal valve,                            appendiceal orifice, and rectum were photographed. Scope In: 3:01:03 PM Scope Out: 3:25:53 PM Scope Withdrawal Time: 0 hours 20 minutes 28 seconds  Total Procedure Duration: 0 hours 24 minutes 50 seconds  Findings:                 The perianal and digital rectal examinations were                            normal.                           The terminal ileum appeared normal.                           A 4 to 5 mm polyp was found in the cecum. The polyp                            was sessile. The polyp was removed with a cold                            snare. Resection and retrieval were complete.  A 6 mm polyp was found in the ascending colon. The                            polyp was sessile. The polyp was removed with a                            cold snare. Resection and retrieval were complete.                           Two sessile polyps were found in the sigmoid colon.                            The polyps were 3 mm in size. These polyps were                            removed with a cold snare. Resection and retrieval                            were complete.                           There was a small lipoma, at the hepatic flexure.                           Multiple small-mouthed diverticula were found in                            the sigmoid colon.                           Internal hemorrhoids were found during                            retroflexion. The hemorrhoids were small.                           The exam was otherwise without abnormality. Complications:             No immediate complications. Estimated blood loss:                            Minimal. Estimated Blood Loss:     Estimated blood loss was minimal. Impression:               - The examined portion of the ileum was normal.                           - One 4 to 5 mm polyp in the cecum, removed with a                            cold snare. Resected and retrieved.                           - One 6 mm polyp in the ascending colon, removed  with a cold snare. Resected and retrieved.                           - Two 3 mm polyps in the sigmoid colon, removed                            with a cold snare. Resected and retrieved.                           - Small lipoma at the hepatic flexure.                           - Diverticulosis in the sigmoid colon.                           - Internal hemorrhoids.                           - The examination was otherwise normal.                           No cause for anemia on this exam. See EGD note for                            recommendations Recommendation:           - Patient has a contact number available for                            emergencies. The signs and symptoms of potential                            delayed complications were discussed with the                            patient. Return to normal activities tomorrow.                            Written discharge instructions were provided to the                            patient.                           - Resume previous diet.                           - Continue present medications.                           - Await pathology results.                           - Likely no further surveillance colonoscopy                            recommended due  to age and comorbidities, and no                            high risk lesions on this exam Carlota Raspberry. Greco Gastelum, MD 02/21/2021 3:32:12 PM This report has been signed electronically.

## 2021-02-21 NOTE — Progress Notes (Signed)
Report to PACU, RN, vss, BBS= Clear.  

## 2021-02-21 NOTE — Progress Notes (Signed)
Called to room to assist during endoscopic procedure.  Patient ID and intended procedure confirmed with present staff. Received instructions for my participation in the procedure from the performing physician.  

## 2021-02-21 NOTE — Patient Instructions (Signed)
AVOID NSAIDS SUCH AS IBUPROFEN,ALEVE, ADVIL,ETC.  ( TYLENOL IS OK)  Handouts on polyps ,diverticulosis, & hemorrhoids given to you today  Await pathology results on polyps removed   Await pathology on stomach biopsies done   TREND BLOOD COUNTS FOR NOW     YOU HAD AN ENDOSCOPIC PROCEDURE TODAY AT [facility]:   Refer to the procedure report that was given to you for any specific questions about what was found during the examination.  If the procedure report does not answer your questions, please call your gastroenterologist to clarify.  If you requested that your care partner not be given the details of your procedure findings, then the procedure report has been included in a sealed envelope for you to review at your convenience later.  YOU SHOULD EXPECT: Some feelings of bloating in the abdomen. Passage of more gas than usual.  Walking can help get rid of the air that was put into your GI tract during the procedure and reduce the bloating. If you had a lower endoscopy (such as a colonoscopy or flexible sigmoidoscopy) you may notice spotting of blood in your stool or on the toilet paper. If you underwent a bowel prep for your procedure, you may not have a normal bowel movement for a few days.  Please Note:  You might notice some irritation and congestion in your nose or some drainage.  This is from the oxygen used during your procedure.  There is no need for concern and it should clear up in a day or so.  SYMPTOMS TO REPORT IMMEDIATELY:   Following lower endoscopy (colonoscopy or flexible sigmoidoscopy):  Excessive amounts of blood in the stool  Significant tenderness or worsening of abdominal pains  Swelling of the abdomen that is new, acute  Fever of 100F or higher   Following upper endoscopy (EGD)  Vomiting of blood or coffee ground material  New chest pain or pain under the shoulder blades  Painful or persistently difficult swallowing  New shortness of  breath  Fever of 100F or higher  Black, tarry-looking stools  For urgent or emergent issues, a gastroenterologist can be reached at any hour by calling [phone]. Do not use MyChart messaging for urgent concerns.    DIET:  We do recommend a small meal at first, but then you may proceed to your regular diet.  Drink plenty of fluids but you should avoid alcoholic beverages for 24 hours.  ACTIVITY:  You should plan to take it easy for the rest of today and you should NOT DRIVE or use heavy machinery until tomorrow (because of the sedation medicines used during the test).    FOLLOW UP: Our staff will call the number listed on your records 48-72 hours following your procedure to check on you and address any questions or concerns that you may have regarding the information given to you following your procedure. If we do not reach you, we will leave a message.  We will attempt to reach you two times.  During this call, we will ask if you have developed any symptoms of COVID 19. If you develop any symptoms (ie: fever, flu-like symptoms, shortness of breath, cough etc.) before then, please call [phone].  If you test positive for Covid 19 in the 2 weeks post procedure, please call and report this information to [place].    If any biopsies were taken you will be contacted by phone or by letter within the next 1-3 weeks.  Please call  us at [phone] if you have not heard about the biopsies in 3 weeks.    SIGNATURES/CONFIDENTIALITY: You and/or your care partner have signed paperwork which will be entered into your electronic medical record.  These signatures attest to the fact that that the information above on your After Visit Summary has been reviewed and is understood.  Full responsibility of the confidentiality of this discharge information lies with you and/or your care-partner.

## 2021-02-23 ENCOUNTER — Telehealth: Payer: Self-pay | Admitting: *Deleted

## 2021-02-23 NOTE — Telephone Encounter (Signed)
  Follow up Call-  Call back number 02/21/2021  Post procedure Call Back phone  # [phone]  Permission to leave phone message Yes     Patient questions:  Do you have a fever, pain , or abdominal swelling? No. Pain Score  0 *  Have you tolerated food without any problems? Yes.    Have you been able to return to your normal activities? Yes.    Do you have any questions about your discharge instructions: Diet   No. Medications  No. Follow up visit  No.  Do you have questions or concerns about your Care? No.  Actions: * If pain score is 4 or above: 1. No action needed, pain <4.Have you developed a fever since your procedure? no  2.   Have you had an respiratory symptoms (SOB or cough) since your procedure? no  3.   Have you tested positive for COVID 19 since your procedure no  4.   Have you had any family members/close contacts diagnosed with the COVID 19 since your procedure?  no   If yes to any of these questions please route to Joylene John, RN and Joella Prince, RN

## 2021-02-23 NOTE — Telephone Encounter (Signed)
First attempt, patient answered but wouldn't say anything and hung up.

## 2021-03-05 ENCOUNTER — Other Ambulatory Visit: Payer: Self-pay

## 2021-03-05 DIAGNOSIS — R195 Other fecal abnormalities: Secondary | ICD-10-CM

## 2021-03-05 DIAGNOSIS — D649 Anemia, unspecified: Secondary | ICD-10-CM

## 2021-03-21 ENCOUNTER — Encounter: Payer: Medicare HMO | Admitting: Gastroenterology

## 2021-04-02 ENCOUNTER — Telehealth: Payer: Self-pay

## 2021-04-02 ENCOUNTER — Other Ambulatory Visit: Payer: Self-pay

## 2021-04-02 MED ORDER — SM ANTI-DANDRUFF COAL TAR 0.5 % EX SHAM: MEDICATED_SHAMPOO | Freq: Every evening | CUTANEOUS | 20 refills | Status: AC | PRN

## 2021-04-02 MED ORDER — COAL TAR EXTRACT 0.5 % EX SHAM: MEDICATED_SHAMPOO | CUTANEOUS | 12 refills | Status: AC

## 2021-04-02 NOTE — Telephone Encounter (Signed)
Patients sister is calling in stating that Carl Obrien is in a memory care unit at [facility] [phone]). Ann bought DEMOREX shampoo for him and when she tried bring it to him, they told her that he needs to have a prescription for the shampoo. Wondering if someone can write this prescription so he is able to have shampoo there.

## 2021-04-02 NOTE — Telephone Encounter (Signed)
Rx sent in

## 2021-04-02 NOTE — Telephone Encounter (Signed)
May rx denorex to be used daily or every other day for washing hair. Rx 1 bottle with 20 refills

## 2021-04-04 ENCOUNTER — Telehealth: Payer: Self-pay

## 2021-04-04 NOTE — Telephone Encounter (Signed)
Lm on vm for patient to return call 

## 2021-04-04 NOTE — Telephone Encounter (Signed)
-----   Message from Yevette Edwards, RN sent at 03/05/2021  9:14 AM EDT ----- Regarding: Labs Repeat CBC w/diff. Order in epic

## 2021-04-05 NOTE — Telephone Encounter (Signed)
Spoke with patient to remind him that he is due for repeat labs at this time. No appointment is necessary. Patient is aware that he can stop by the lab in the basement at his convenience between 7:30 AM - 5 PM, Monday through Friday. Patient states that he has not heard from his ride in a while and it may be a week before her can come in. Advised that he can come in as soon as he can. Patient verbalized understanding and had no concerns at the end of the call.

## 2021-04-09 ENCOUNTER — Other Ambulatory Visit: Payer: Self-pay

## 2021-04-09 ENCOUNTER — Other Ambulatory Visit (INDEPENDENT_AMBULATORY_CARE_PROVIDER_SITE_OTHER): Payer: Medicare HMO

## 2021-04-09 DIAGNOSIS — R195 Other fecal abnormalities: Secondary | ICD-10-CM | POA: Diagnosis not present

## 2021-04-09 DIAGNOSIS — D649 Anemia, unspecified: Secondary | ICD-10-CM

## 2021-04-09 LAB — CBC WITH DIFFERENTIAL/PLATELET
Basophils Absolute: 0 10*3/uL (ref 0.0–0.1)
Basophils Relative: 0.5 % (ref 0.0–3.0)
Eosinophils Absolute: 0.2 10*3/uL (ref 0.0–0.7)
Eosinophils Relative: 2.4 % (ref 0.0–5.0)
HCT: 36.4 % — ABNORMAL LOW (ref 39.0–52.0)
Hemoglobin: 12.3 g/dL — ABNORMAL LOW (ref 13.0–17.0)
Lymphocytes Relative: 20.3 % (ref 12.0–46.0)
Lymphs Abs: 1.6 10*3/uL (ref 0.7–4.0)
MCHC: 33.9 g/dL (ref 30.0–36.0)
MCV: 91.9 fl (ref 78.0–100.0)
Monocytes Absolute: 0.8 10*3/uL (ref 0.1–1.0)
Monocytes Relative: 10.1 % (ref 3.0–12.0)
Neutro Abs: 5.4 10*3/uL (ref 1.4–7.7)
Neutrophils Relative %: 66.7 % (ref 43.0–77.0)
Platelets: 261 10*3/uL (ref 150.0–400.0)
RBC: 3.96 Mil/uL — ABNORMAL LOW (ref 4.22–5.81)
RDW: 15 % (ref 11.5–15.5)
WBC: 8 10*3/uL (ref 4.0–10.5)

## 2021-04-10 ENCOUNTER — Telehealth: Payer: Self-pay

## 2021-04-10 NOTE — Telephone Encounter (Signed)
Latoya from [facility] called stated patient has a fall and managed to get himself up off the floor and is refusing to go to ED but I needs an XRAY and wanted to know if we can order a mobile xray and send the order to the facility [phone] #FAX   Latoyas # is [phone]

## 2021-04-10 NOTE — Telephone Encounter (Signed)
Ed or urgent care is reasonable.   There is not enough information provided as to why he needs an x-ray or even location that he needs the x-ray. I am open to ordering but have to have more information.

## 2021-04-10 NOTE — Telephone Encounter (Signed)
Dr. Hunter, please see message and advise. 

## 2021-04-11 NOTE — Telephone Encounter (Signed)
Dr. Yong Channel please see message regarding pt and needing mobile x-ray. Please send message to [facility] when finished so she can take care of orders and call Latyoya back. I am off tomorrow 5/26. Thank you.

## 2021-04-11 NOTE — Telephone Encounter (Signed)
Spoke to [place], asked her why they needed x-ray did pt hurt himself when he fell? Latyoya said yes, pt is c/o pain right leg since and having trouble bearing weight on it. Asked her if pt could go to an Urgent care to get evaluated and x-ray done? Latyoya said pt is terminally ill and does not think wife could take him and pt refused ED. Told her I will send message back to Dr. Yong Channel to get order for mobile x-ray for pt. Told her he has gone for the day but someone will get back to her tomorrow. Latyoya verbalized understanding.

## 2021-04-11 NOTE — Telephone Encounter (Signed)
Carl Obrien returned your call

## 2021-04-11 NOTE — Telephone Encounter (Signed)
Left message on Carl Obrien's voicemail to call office.

## 2021-04-12 NOTE — Telephone Encounter (Signed)
Carl Obrien states pt is not on hospice he just has dementia. Pt is putting weight on that leg so they do not think there is any type of fracture pt is just insisting that "something be done" so this is why they are wanting the xray and they do not think ortho will need to be involved.

## 2021-04-12 NOTE — Telephone Encounter (Signed)
I am open to an x-ray being ordered-it still simply states right leg-is this the femur or tip/fib-I am open to either being ordered for x-ray under right leg pain depending on where he is hurting  When they say he is terminally ill-is he on hospice?  Even if he has a fracture one of my questions is how will he be treated because we would need orthopedic input

## 2021-04-12 NOTE — Telephone Encounter (Signed)
May order x-ray under right leg pain for either femur or tibia/fibula

## 2021-04-12 NOTE — Telephone Encounter (Signed)
Called and spoke with Latoya and she is going to have the nurse speak with the pt to gather more information b/c she is not sure what part of his leg or his hip and she will give me a call back.

## 2021-04-12 NOTE — Telephone Encounter (Signed)
Rx for xray ordered and faxed to [facility].

## 2021-04-12 NOTE — Telephone Encounter (Signed)
Carl Obrien has called back.  Patient states its the back side of right leg and ankle behind knee that is needing xray.

## 2021-04-18 ENCOUNTER — Ambulatory Visit (INDEPENDENT_AMBULATORY_CARE_PROVIDER_SITE_OTHER): Payer: Medicare HMO

## 2021-04-18 DIAGNOSIS — I10 Essential (primary) hypertension: Secondary | ICD-10-CM | POA: Diagnosis not present

## 2021-04-18 DIAGNOSIS — M1611 Unilateral primary osteoarthritis, right hip: Secondary | ICD-10-CM

## 2021-04-18 NOTE — Progress Notes (Signed)
Chronic Care Management Pharmacy Note  04/18/2021 Name:  Carl Obrien MRN:  [id number] DOB:  1946-06-07  Summary:  Reviewed medication related needs - no current issues.  Recommendations/Changes made from today's visit: None.   Plan: Patient to contact me if medication related need arises (such as financial assistance on psoriasis tx) otherwise will check back in w/ family ~8-10 months.  Subjective: Carl Obrien is an 75 y.o. year old male who is a primary patient of Allwardt, Alyssa M, PA-C.  The CCM team was consulted for assistance with disease management and care coordination needs.    Engaged with patient by telephone for follow up visit in response to provider referral for pharmacy case management and/or care coordination services. Spoke with Patient's sister in law, [name].   Consent to Services:  The patient was given information about Chronic Care Management services, agreed to services, and gave verbal consent prior to initiation of services.  Please see initial visit note for detailed documentation.   Patient Care Team: Allwardt, [name], PA-C as PCP - General (Physician Assistant) Lavonna Monarch, MD as Consulting Physician (Dermatology) Leona Singleton, RN as Case Manager Madelin Rear, [facility] (Pharmacist)  Objective:  Lab Results  Component Value Date   CREATININE 1.40 (H) 01/03/2021   CREATININE 1.14 09/22/2020   CREATININE 1.76 (H) 07/19/2020    No results found for: HGBA1C Last diabetic Eye exam: No results found for: HMDIABEYEEXA  Last diabetic Foot exam: No results found for: HMDIABFOOTEX      Component Value Date/Time   CHOL 225 (A) 11/17/2019 0000   TRIG 102 11/17/2019 0000   HDL 69 11/17/2019 0000   LDLCALC 138 11/17/2019 0000    Hepatic Function Latest Ref Rng & Units 01/03/2021 09/22/2020 07/19/2020  Total Protein 6.1 - 8.1 g/dL 6.4 6.7 6.1  Albumin 3.5 - 5.0 g/dL - 4.1 4.3  AST 10 - 35 U/L 17 19 13   ALT 9 - 46 U/L 16 16 15   Alk Phosphatase 38  - 126 U/L - 91 81  Total Bilirubin 0.2 - 1.2 mg/dL 0.4 0.5 0.5    Lab Results  Component Value Date/Time   TSH 2.80 01/29/2021 04:45 PM   TSH 1.67 10/06/2019 12:00 AM    CBC Latest Ref Rng & Units 04/09/2021 01/29/2021 01/03/2021  WBC 4.0 - 10.5 K/uL 8.0 6.3 6.5  Hemoglobin 13.0 - 17.0 g/dL 12.3(L) 13.0 13.6  Hematocrit 39.0 - 52.0 % 36.4(L) 38.4(L) 40.5  Platelets 150.0 - 400.0 K/uL 261.0 193.0 224    No results found for: VD25OH  Clinical ASCVD:  The 10-year ASCVD risk score Mikey Bussing DC Jr., et al., 2013) is: 23.2%*   Values used to calculate the score:     Age: 28 years     Sex: Male     Is Non-Hispanic African American: No     Diabetic: No     Tobacco smoker: No     Systolic Blood Pressure: 737 mmHg     Is BP treated: Yes     HDL Cholesterol: 69 mg/dL*     Total Cholesterol: 225 mg/dL*     * - Cholesterol units were assumed for this score calculation    Social History   Tobacco Use  Smoking Status Former Smoker  . Types: Cigarettes  Smokeless Tobacco Never Used  Tobacco Comment   years ago   BP Readings from Last 3 Encounters:  02/21/21 117/65  01/29/21 112/70  01/19/21 114/70   Pulse Readings from Last 3  Encounters:  02/21/21 72  01/29/21 70  01/19/21 65   Wt Readings from Last 3 Encounters:  02/21/21 147 lb (66.7 kg)  01/29/21 147 lb 12.8 oz (67 kg)  01/19/21 149 lb 3.2 oz (67.7 kg)    Assessment: Review of patient past medical history, allergies, medications, health status, including review of consultants reports, laboratory and other test data, was performed as part of comprehensive evaluation and provision of chronic care management services.   SDOH:  (Social Determinants of Health) assessments and interventions performed:    CCM Care Plan  Allergies  Allergen Reactions  . Ciprofloxacin Other (See Comments)    unknown    Medications Reviewed Today    Reviewed by Madelin Rear, [facility] (Pharmacist) on 04/18/21 at 1132  Med List Status: <None>   Medication Order Taking? Sig Documenting Provider Last Dose Status Informant  acetaminophen (TYLENOL) 500 MG tablet [id number] No Take 500 mg by mouth at bedtime. [provider] Unknown Active   albuterol (VENTOLIN HFA) 108 (90 Base) MCG/ACT inhaler [id number] No Inhale 2 puffs into the lungs 2 (two) times daily as needed for wheezing or shortness of breath.   Patient not taking: Reported on 02/21/2021   [provider] Not Taking Active Nursing Home Medication Administration Guide (MAG)  allopurinol (ZYLOPRIM) 100 MG tablet [id number] No Take 100 mg by mouth daily. (0900) [provider] Unknown Active Nursing Home Medication Administration Guide (MAG)  atorvastatin (LIPITOR) 40 MG tablet [id number] No Take 40 mg by mouth at bedtime. (2000) [provider] Unknown Active Nursing Home Medication Administration Guide (MAG)  azelastine (ASTELIN) 0.1 % nasal spray [id number] No Place 1 spray into both nostrils 2 (two) times daily. Use in each nostril as directed Delorse Limber 02/20/2021 Active   cloNIDine (CATAPRES) 0.1 MG tablet [id number] No Take 0.1 mg by mouth every 8 (eight) hours as needed (hypertension.).  [provider] Unknown Active Nursing Home Medication Administration Guide (MAG)  coal tar (NEUTROGENA T-GEL) 0.5 % shampoo [id number]  Apply topically every other day. Vivi Barrack, MD  Active   coal tar (SM ANTI-DANDRUFF COAL TAR) 0.5 % shampoo [id number]  Apply topically at bedtime as needed. Marin Olp, MD  Active   docusate sodium (COLACE) 100 MG capsule [id number] No Take 1 capsule (100 mg total) by mouth 2 (two) times daily as needed (for constipation). Dereck Leep, PA-C Unknown Active   donepezil (ARICEPT) 5 MG tablet [id number] No Take 5 mg by mouth at bedtime. (2000) [provider] Unknown Active Nursing Home Medication Administration Guide (MAG)  fluticasone (FLONASE) 50 MCG/ACT nasal spray [id number] No Place 1 spray  into both nostrils 2 (two) times daily. (0900 & 1600) [provider] 02/20/2021 Active Nursing Home Medication Administration Guide (MAG)  hydrochlorothiazide (HYDRODIURIL) 25 MG tablet [id number] No Take 25 mg by mouth daily. (0900) [provider] Unknown Active Nursing Home Medication Administration Guide (MAG)  latanoprost (XALATAN) 0.005 % ophthalmic solution [id number] No Place 1 drop into both eyes at bedtime.  [provider] Past Week Active Nursing Home Medication Administration Guide (MAG)  loratadine (CLARITIN) 10 MG tablet [id number] No Take 10 mg by mouth daily. [provider] Unknown Active Nursing Home Medication Administration Guide (MAG)  losartan (COZAAR) 50 MG tablet [id number] No Take 50 mg by mouth 2 (two) times daily. (0900 & 1600) [provider] Unknown Active Nursing Home Medication Administration Guide (MAG)  montelukast (SINGULAIR) 10 MG tablet [id number] No Take 10 mg by  mouth daily. (0900) [provider] Unknown Active Nursing Home Medication Administration Guide (MAG)  oxyCODONE (OXY IR/ROXICODONE) 5 MG immediate release tablet [id number] No Take 5 mg by mouth in the morning and at bedtime. [provider] Unknown Active Nursing Home Medication Administration Guide (MAG)  oxyCODONE-acetaminophen (PERCOCET/ROXICET) 5-325 MG tablet [id number] No Take 1 tablet by mouth every 6 (six) hours as needed for severe pain. [provider] Unknown Active   pantoprazole (PROTONIX) 40 MG tablet [id number] No Take 40 mg by mouth daily at 6 (six) AM. (0600) [provider] Unknown Active Nursing Home Medication Administration Guide (MAG)  sertraline (ZOLOFT) 25 MG tablet [id number] No Take 25 mg by mouth at bedtime. (2000) [provider] Unknown Active Nursing Home Medication Administration Guide (MAG)  Sunscreens (COPPERTONE SPORT SPF 30 EX) [id number] No Apply 1 application topically every 2 (two) hours as  needed (while in the sun). [provider] Unknown Active   thiamine 100 MG tablet [id number] No Take 100 mg by mouth daily. (0900) [provider] Unknown Active Nursing Home Medication Administration Guide (MAG)  timolol (BETIMOL) 0.5 % ophthalmic solution [id number] No Place 1 drop into both eyes 2 (two) times daily. (0900 & 1600) [provider] Unknown Active Nursing Home Medication Administration Guide (MAG)  tiZANidine (ZANAFLEX) 4 MG tablet [id number] No Take 1 tablet (4 mg total) by mouth every 8 (eight) hours as needed for muscle spasms. Dereck Leep, PA-C Unknown Active   triamcinolone (KENALOG) 0.1 % [id number] No Apply 1 application topically daily. [provider] Unknown Active   vitamin B-12 (CYANOCOBALAMIN) 1000 MCG tablet [id number] No Take 1,000 mcg by mouth daily. (0900) [provider] Unknown Active Nursing Home Medication Administration Guide (MAG)  zolpidem (AMBIEN) 5 MG tablet [id number] No Take 5 mg by mouth at bedtime. [provider] Unknown Active   Med List Note Marylynn [facility] 09/08/20 1326): Patient is a resident of [facility] [phone] OR [phone] OR [phone]          Patient Active Problem List   Diagnosis Date Noted  . Essential hypertension 01/19/2021  . Dementia without behavioral disturbance ([place]) 01/19/2021  . Plaque psoriasis 01/19/2021  . Chronic obstructive pulmonary disease ([place]) 01/19/2021  . Personal history of prostate cancer 01/19/2021  . Primary osteoarthritis of right hip 09/22/2020  . Status post total hip replacement, right 09/22/2020    Immunization History  Administered Date(s) Administered  . Pneumococcal Conjugate-13 12/24/2011, 12/22/2015  . Pneumococcal Polysaccharide-23 05/23/2011  . Tdap 05/23/2011  . Zoster, Live 11/26/2011   Conditions to be addressed/monitored: HTN, HLD and OA  Care Plan : [place]  Updates made by Madelin Rear, [facility]  since 04/18/2021 12:00 AM    Problem: HLD HTN Osteoarthritis COPD   Priority: High    Long-Range Goal: Disease Management   Start Date: 02/07/2021  Expected End Date: 02/07/2022  Recent Progress: On track  Priority: High  Note:    Current Barriers:  . N/A  Pharmacist Clinical Goal(s):  Marland Kitchen Patient will contact provider office for questions/concerns as evidenced notation of same in electronic health record through collaboration with PharmD and provider.   Interventions: . 1:1 collaboration with Allwardt, [name], PA-C regarding development and update of comprehensive plan of care as evidenced by provider attestation and co-signature . Inter-disciplinary care team collaboration (see longitudinal plan of care) . Comprehensive medication review performed; medication list updated in electronic medical record . No changes  Hypertension (BP goal <140/90) -  Controlled -Current treatment: . HCTZ 25 mg once daily  . Clonidine 0.1 mg once every 8 hours as needed for hypertension . Losartan 50 mg twice daily  -Current exercise habits: able to walk unassisted however due to recent fall had been using wheel chair. Nursing staff reported to sister in law, [name], that he did not have fracture and is able to bear weight without issue.  -Denies hypotensive/hypertensive symptoms -BP monitored/managed by facility - [name] denies any recent issues reported -Recommended to continue current medication  OA (ensure safe medication use) -No longer on NSAID or opioid tx. Pain has improved since hip replacement.  -Current treatment:  APAP 500 mg as needed for pain -No issues noted -Confirmed patient is off of NSAIDs - recent slight decrease in hgb so psoriasis tx postponed. Continued monitoring through GI.  -Recommended continue current medication   Patient Goals/Self-Care Activities . Patient will:  - take medications as prescribed collaborate with provider on medication access solutions  Follow Up Plan:  f/u with CCM pharmacist as needed. Upcoming CCM visit, happy to help if need arises. Appears facility is managing chronic medications at this point. Routine fills noted through facility pharmacy on all chronic meds. Will check back in with family in 8-10 months otherwise.   Medication Assistance: None required.  Patient affirms current coverage meets needs.  Patient's preferred pharmacy is:  [facility] [place], [place] - [address] [place] [address] Phone: [phone] Fax: [phone]  Follow Up:  Patient agrees to Care Plan and Follow-up.    Future Appointments  Date Time Provider [place]  05/24/2021 10:00 AM [facility] C[facility]  07/24/2021 11:00 AM Allwardt, [name], PA-C [facility]  12/19/2021 10:30 AM [facility] CCM PHARMACIST [facility]   Madelin Rear, PharmD, CPP Clinical Pharmacist Practitioner  [facility]  [phone]

## 2021-04-18 NOTE — Patient Instructions (Signed)
Mr. Matheney,  Thank you for talking with me today. I have included our care plan/goals in the following pages.   Please review and call me at [phone] with any questions.  Thanks! Ellin Mayhew, Pharm.D., BCGP Clinical Pharmacist [facility] [place] [phone] Care Plan : [place]  Updates made by Madelin Rear, [facility] since 04/18/2021 12:00 AM    Problem: HLD HTN Osteoarthritis COPD   Priority: High    Long-Range Goal: Disease Management   Start Date: 02/07/2021  Expected End Date: 02/07/2022  Recent Progress: On track  Priority: High  Note:    Current Barriers:  . N/A  Pharmacist Clinical Goal(s):  Marland Kitchen Patient will contact provider office for questions/concerns as evidenced notation of same in electronic health record through collaboration with PharmD and provider.   Interventions: . 1:1 collaboration with Allwardt, [name], PA-C regarding development and update of comprehensive plan of care as evidenced by provider attestation and co-signature . Inter-disciplinary care team collaboration (see longitudinal plan of care) . Comprehensive medication review performed; medication list updated in electronic medical record . No changes  Hypertension (BP goal <140/90) -Controlled -Current treatment: . HCTZ 25 mg once daily  . Clonidine 0.1 mg once every 8 hours as needed for hypertension . Losartan 50 mg twice daily  -Current exercise habits: able to walk unassisted however due to recent fall had been using wheel chair. Nursing staff reported to sister in law, [name], that he did not have fracture and is able to bear weight without issue.  -Denies hypotensive/hypertensive symptoms -BP monitored/managed by facility - [name] denies any recent issues reported -Recommended to continue current medication  OA (ensure safe medication use) -No longer on NSAID or opioid tx. Pain has improved since hip replacement.  -Current  treatment:  APAP 500 mg as needed for pain -No issues noted -Confirmed patient is off of NSAIDs - recent slight decrease in hgb so psoriasis tx postponed. Continued monitoring through GI.  -Recommended continue current medication   Patient Goals/Self-Care Activities . Patient will:  - take medications as prescribed collaborate with provider on medication access solutions  Follow Up Plan: f/u with CCM pharmacist as needed. Upcoming CCM visit, happy to help if need arises. Appears facility is managing chronic medications at this point. Routine fills noted through facility pharmacy on all chronic meds. Will check back in with family in 8-10 months otherwise.   Medication Assistance: None required.  Patient affirms current coverage meets needs.  Patient's preferred pharmacy is:  [facility] of [place], [place] - [address] [place] [address] Phone: [phone] Fax: [phone]  Follow Up:  Patient agrees to Care Plan and Follow-up.   The patient verbalized understanding of instructions provided today and agreed to receive a mailed copy of patient instruction and/or educational materials. Telephone follow up appointment with pharmacy team member scheduled for: See next appointment with "Care Management Staff" under "What's Next" below.

## 2021-05-09 ENCOUNTER — Telehealth: Payer: Self-pay

## 2021-05-09 DIAGNOSIS — D649 Anemia, unspecified: Secondary | ICD-10-CM

## 2021-05-09 DIAGNOSIS — R195 Other fecal abnormalities: Secondary | ICD-10-CM

## 2021-05-09 DIAGNOSIS — L400 Psoriasis vulgaris: Secondary | ICD-10-CM

## 2021-05-09 DIAGNOSIS — R768 Other specified abnormal immunological findings in serum: Secondary | ICD-10-CM

## 2021-05-09 NOTE — Telephone Encounter (Signed)
-----   Message from Yevette Edwards, RN sent at 02/06/2021  9:37 AM EDT ----- Regarding: Labs Repeat LFTs, and Hep B DNA. Order in epic

## 2021-05-09 NOTE — Telephone Encounter (Signed)
Spoke with [name] to remind her that patient is due for lab work at this time. [name] is aware that no appt is necessary and they can stop by the lab at their convenience between 7:30 am - 5 pm, Monday through Friday. Ann verbalized understanding and had no concerns at the end of the call.

## 2021-05-09 NOTE — Telephone Encounter (Signed)
Lm on home vm for patient to return call.  

## 2021-05-10 ENCOUNTER — Other Ambulatory Visit (INDEPENDENT_AMBULATORY_CARE_PROVIDER_SITE_OTHER): Payer: Medicare HMO

## 2021-05-10 DIAGNOSIS — R768 Other specified abnormal immunological findings in serum: Secondary | ICD-10-CM

## 2021-05-10 DIAGNOSIS — L400 Psoriasis vulgaris: Secondary | ICD-10-CM

## 2021-05-10 DIAGNOSIS — D649 Anemia, unspecified: Secondary | ICD-10-CM | POA: Diagnosis not present

## 2021-05-10 DIAGNOSIS — R195 Other fecal abnormalities: Secondary | ICD-10-CM

## 2021-05-10 LAB — CBC WITH DIFFERENTIAL/PLATELET
Basophils Absolute: 0 10*3/uL (ref 0.0–0.1)
Basophils Relative: 0.7 % (ref 0.0–3.0)
Eosinophils Absolute: 0.2 10*3/uL (ref 0.0–0.7)
Eosinophils Relative: 2.3 % (ref 0.0–5.0)
HCT: 35.6 % — ABNORMAL LOW (ref 39.0–52.0)
Hemoglobin: 12.3 g/dL — ABNORMAL LOW (ref 13.0–17.0)
Lymphocytes Relative: 22.3 % (ref 12.0–46.0)
Lymphs Abs: 1.6 10*3/uL (ref 0.7–4.0)
MCHC: 34.4 g/dL (ref 30.0–36.0)
MCV: 90.2 fl (ref 78.0–100.0)
Monocytes Absolute: 0.6 10*3/uL (ref 0.1–1.0)
Monocytes Relative: 8.4 % (ref 3.0–12.0)
Neutro Abs: 4.7 10*3/uL (ref 1.4–7.7)
Neutrophils Relative %: 66.3 % (ref 43.0–77.0)
Platelets: 217 10*3/uL (ref 150.0–400.0)
RBC: 3.95 Mil/uL — ABNORMAL LOW (ref 4.22–5.81)
RDW: 15.1 % (ref 11.5–15.5)
WBC: 7 10*3/uL (ref 4.0–10.5)

## 2021-05-10 LAB — HEPATIC FUNCTION PANEL
ALT: 15 U/L (ref 0–53)
AST: 14 U/L (ref 0–37)
Albumin: 4.3 g/dL (ref 3.5–5.2)
Alkaline Phosphatase: 117 U/L (ref 39–117)
Bilirubin, Direct: 0.1 mg/dL (ref 0.0–0.3)
Total Bilirubin: 0.3 mg/dL (ref 0.2–1.2)
Total Protein: 6.5 g/dL (ref 6.0–8.3)

## 2021-05-12 LAB — HEPATITIS B DNA, ULTRAQUANTITATIVE, PCR
Hepatitis B DNA (Calc): <1 Log IU/mL
Hepatitis B DNA: <10 IU/mL

## 2021-05-17 ENCOUNTER — Other Ambulatory Visit: Payer: Self-pay

## 2021-05-17 DIAGNOSIS — L400 Psoriasis vulgaris: Secondary | ICD-10-CM

## 2021-05-17 DIAGNOSIS — R768 Other specified abnormal immunological findings in serum: Secondary | ICD-10-CM

## 2021-05-17 DIAGNOSIS — D649 Anemia, unspecified: Secondary | ICD-10-CM

## 2021-05-17 DIAGNOSIS — R195 Other fecal abnormalities: Secondary | ICD-10-CM

## 2021-05-18 ENCOUNTER — Other Ambulatory Visit: Payer: Self-pay | Admitting: *Deleted

## 2021-05-18 DIAGNOSIS — D649 Anemia, unspecified: Secondary | ICD-10-CM

## 2021-05-18 DIAGNOSIS — R768 Other specified abnormal immunological findings in serum: Secondary | ICD-10-CM

## 2021-05-22 ENCOUNTER — Telehealth: Payer: Self-pay

## 2021-05-22 NOTE — Telephone Encounter (Signed)
[facility] called regarding the patient's Ambien prescription. Please call back at [phone]

## 2021-05-23 ENCOUNTER — Other Ambulatory Visit: Payer: Self-pay | Admitting: Physician Assistant

## 2021-05-23 MED ORDER — ZOLPIDEM TARTRATE 5 MG PO TABS: 5.0000 mg | ORAL_TABLET | Freq: Every day | ORAL | 0 refills | Status: AC

## 2021-05-23 NOTE — Telephone Encounter (Signed)
Form given to PCP to sign 

## 2021-05-24 ENCOUNTER — Telehealth: Payer: Medicare HMO

## 2021-06-07 ENCOUNTER — Telehealth: Payer: Medicare HMO

## 2021-06-18 ENCOUNTER — Telehealth: Payer: Self-pay | Admitting: Physician Assistant

## 2021-06-18 NOTE — Telephone Encounter (Signed)
Declined per pt son-Dementia

## 2021-07-05 ENCOUNTER — Ambulatory Visit: Payer: Medicare HMO | Admitting: *Deleted

## 2021-07-05 DIAGNOSIS — I10 Essential (primary) hypertension: Secondary | ICD-10-CM

## 2021-07-05 DIAGNOSIS — F0390 Unspecified dementia without behavioral disturbance: Secondary | ICD-10-CM

## 2021-07-05 NOTE — Chronic Care Management (AMB) (Signed)
  Care Management   Follow Up Note   07/05/2021 Name: Carl Obrien MRN: [id number] DOB: 15-May-1946   Referred by: Allwardt, [name], PA-C Reason for referral : Case Closure   Successful telephone outreach to patient's sister in law, [name] for verification of CCM services.  Multiple follow up appointments had been cancelled by family.   Per sister in law, patient has switched primary care providers back to [name], provider at [facility].  Very appreciative of previous CCM services, but declines further RNCM services at this time.  Follow Up Plan: No further follow up required: as patient has new PCP at [facility].  Hubert Azure RN, MSN RN Care Management Coordinator  [facility] [phone] Page [email or web address]

## 2021-07-09 ENCOUNTER — Telehealth: Payer: Self-pay | Admitting: Pharmacist

## 2021-07-09 NOTE — Chronic Care Management (AMB) (Signed)
Chronic Care Management Pharmacy Assistant   Name: Carl Obrien  MRN: [id number] DOB: 1946/05/07   Reason for Encounter: General Adherence Call    Recent office visits:  None  Recent consult visits:  None  Hospital visits:  None in previous 6 months  Medications: Outpatient Encounter Medications as of 07/09/2021  Medication Sig   coal tar (SM ANTI-DANDRUFF COAL TAR) 0.5 % shampoo Apply topically at bedtime as needed.   acetaminophen (TYLENOL) 500 MG tablet Take 500 mg by mouth at bedtime.   albuterol (VENTOLIN HFA) 108 (90 Base) MCG/ACT inhaler Inhale 2 puffs into the lungs 2 (two) times daily as needed for wheezing or shortness of breath.  (Patient not taking: Reported on 02/21/2021)   allopurinol (ZYLOPRIM) 100 MG tablet Take 100 mg by mouth daily. (0900)   atorvastatin (LIPITOR) 40 MG tablet Take 40 mg by mouth at bedtime. (2000)   azelastine (ASTELIN) 0.1 % nasal spray Place 1 spray into both nostrils 2 (two) times daily. Use in each nostril as directed   cloNIDine (CATAPRES) 0.1 MG tablet Take 0.1 mg by mouth every 8 (eight) hours as needed (hypertension.).    coal tar (NEUTROGENA T-GEL) 0.5 % shampoo Apply topically every other day.   docusate sodium (COLACE) 100 MG capsule Take 1 capsule (100 mg total) by mouth 2 (two) times daily as needed (for constipation).   donepezil (ARICEPT) 5 MG tablet Take 5 mg by mouth at bedtime. (2000)   fluticasone (FLONASE) 50 MCG/ACT nasal spray Place 1 spray into both nostrils 2 (two) times daily. (0900 & 1600)   hydrochlorothiazide (HYDRODIURIL) 25 MG tablet Take 25 mg by mouth daily. (0900)   latanoprost (XALATAN) 0.005 % ophthalmic solution Place 1 drop into both eyes at bedtime.    loratadine (CLARITIN) 10 MG tablet Take 10 mg by mouth daily.   losartan (COZAAR) 50 MG tablet Take 50 mg by mouth 2 (two) times daily. (0900 & 1600)   montelukast (SINGULAIR) 10 MG tablet Take 10 mg by mouth daily. (0900)   oxyCODONE (OXY IR/ROXICODONE) 5  MG immediate release tablet Take 5 mg by mouth in the morning and at bedtime.   oxyCODONE-acetaminophen (PERCOCET/ROXICET) 5-325 MG tablet Take 1 tablet by mouth every 6 (six) hours as needed for severe pain.   pantoprazole (PROTONIX) 40 MG tablet Take 40 mg by mouth daily at 6 (six) AM. (0600)   sertraline (ZOLOFT) 25 MG tablet Take 25 mg by mouth at bedtime. (2000)   Sunscreens (COPPERTONE SPORT SPF 30 EX) Apply 1 application topically every 2 (two) hours as needed (while in the sun).   thiamine 100 MG tablet Take 100 mg by mouth daily. (0900)   timolol (BETIMOL) 0.5 % ophthalmic solution Place 1 drop into both eyes 2 (two) times daily. (0900 & 1600)   tiZANidine (ZANAFLEX) 4 MG tablet Take 1 tablet (4 mg total) by mouth every 8 (eight) hours as needed for muscle spasms.   triamcinolone (KENALOG) 0.1 % Apply 1 application topically daily.   vitamin B-12 (CYANOCOBALAMIN) 1000 MCG tablet Take 1,000 mcg by mouth daily. (0900)   zolpidem (AMBIEN) 5 MG tablet Take 1 tablet (5 mg total) by mouth at bedtime.   No facility-administered encounter medications on file as of 07/09/2021.    Per last CCM note patient has new PCP at [facility].  No future appointments.   Star Rating Drugs: Atorvastatin 40 mg last filled 06/06/2021 30  DS Losartan Potassium last filled 06/06/2021 30 DS  April D  Governor Speck[place], [place] Pharmacist Assistant [phone]

## 2021-07-24 ENCOUNTER — Ambulatory Visit: Payer: Medicare HMO | Admitting: Physician Assistant

## 2021-08-20 ENCOUNTER — Telehealth: Payer: Self-pay

## 2021-08-20 NOTE — Telephone Encounter (Signed)
Left detailed vm on patient's home phone reminding him that he is due for repeat labs at this time. Advised that no appt is necessary and he can stop by the lab at his convenience between 7:30 am - 5 pm, Monday through Friday. Advised patient to give me a call back to let me know that the vm was received.

## 2021-08-20 NOTE — Telephone Encounter (Signed)
-----   Message from Yevette Edwards, RN sent at 05/17/2021 10:54 AM EDT ----- Regarding: Labs Repeat LFT's, Hep B DNA. Orders in epic.

## 2021-08-22 NOTE — Telephone Encounter (Signed)
Lm on vm for patient to return call 

## 2021-08-23 NOTE — Telephone Encounter (Signed)
Spoke with Carl Obrien to reminder her that pt is due for repeat labs at this time. No appointment is necessary. Carl Obrien is aware that he can stop by the lab in the basement at his convenience between 7:30 AM - 5 PM. She states that they will either be by the office tomorrow or Monday. Ann verbalized understanding and had no concerns at the end of the call.

## 2021-08-24 ENCOUNTER — Other Ambulatory Visit: Payer: Medicare HMO

## 2021-08-24 DIAGNOSIS — L400 Psoriasis vulgaris: Secondary | ICD-10-CM

## 2021-08-24 DIAGNOSIS — D649 Anemia, unspecified: Secondary | ICD-10-CM

## 2021-08-24 DIAGNOSIS — R195 Other fecal abnormalities: Secondary | ICD-10-CM

## 2021-08-24 DIAGNOSIS — R768 Other specified abnormal immunological findings in serum: Secondary | ICD-10-CM

## 2021-08-24 LAB — HEPATIC FUNCTION PANEL
ALT: 16 U/L (ref 0–53)
AST: 16 U/L (ref 0–37)
Albumin: 4.7 g/dL (ref 3.5–5.2)
Alkaline Phosphatase: 92 U/L (ref 39–117)
Bilirubin, Direct: 0.1 mg/dL (ref 0.0–0.3)
Total Bilirubin: 0.6 mg/dL (ref 0.2–1.2)
Total Protein: 7 g/dL (ref 6.0–8.3)

## 2021-08-27 LAB — HEPATITIS B DNA, ULTRAQUANTITATIVE, PCR
Hepatitis B DNA (Calc): <1 Log IU/mL
Hepatitis B DNA: <10 IU/mL

## 2021-08-28 ENCOUNTER — Other Ambulatory Visit: Payer: Self-pay

## 2021-08-28 DIAGNOSIS — L400 Psoriasis vulgaris: Secondary | ICD-10-CM

## 2021-08-28 DIAGNOSIS — R768 Other specified abnormal immunological findings in serum: Secondary | ICD-10-CM

## 2021-11-28 ENCOUNTER — Telehealth: Payer: Self-pay

## 2021-11-28 NOTE — Telephone Encounter (Signed)
Lm on home vm for patient to return call.  

## 2021-11-28 NOTE — Telephone Encounter (Signed)
-----   Message from Yevette Edwards, RN sent at 08/28/2021 11:48 AM EDT ----- Regarding: Labs Repeat LFT's and Hep B DNA PCR. Orders are in epic.

## 2021-11-30 ENCOUNTER — Other Ambulatory Visit: Payer: Medicare HMO

## 2021-11-30 DIAGNOSIS — L400 Psoriasis vulgaris: Secondary | ICD-10-CM

## 2021-11-30 DIAGNOSIS — R768 Other specified abnormal immunological findings in serum: Secondary | ICD-10-CM

## 2021-11-30 LAB — HEPATIC FUNCTION PANEL
ALT: 19 U/L (ref 0–53)
AST: 19 U/L (ref 0–37)
Albumin: 4.5 g/dL (ref 3.5–5.2)
Alkaline Phosphatase: 84 U/L (ref 39–117)
Bilirubin, Direct: 0.1 mg/dL (ref 0.0–0.3)
Total Bilirubin: 0.6 mg/dL (ref 0.2–1.2)
Total Protein: 6.7 g/dL (ref 6.0–8.3)

## 2021-11-30 NOTE — Telephone Encounter (Signed)
Called and spoke with [name]. She is aware that patient is due for repeat labs at this time. She knows that they can stop by at their convenience, no appt necessary. [name] states that she will bring pt by for labs. She verbalized understanding and had no concerns at the end of the call.

## 2021-12-04 LAB — HEPATITIS B DNA, ULTRAQUANTITATIVE, PCR
Hepatitis B DNA (Calc): <1 Log IU/mL
Hepatitis B DNA: <10 IU/mL

## 2021-12-19 ENCOUNTER — Telehealth: Payer: Medicare HMO

## 2022-01-17 ENCOUNTER — Emergency Department (HOSPITAL_COMMUNITY)
Admission: EM | Admit: 2022-01-17 | Discharge: 2022-01-17 | Disposition: A | Discharge status: Skilled Nursing Facility | Payer: Medicare HMO | Attending: Emergency Medicine | Admitting: Emergency Medicine

## 2022-01-17 ENCOUNTER — Other Ambulatory Visit: Payer: Self-pay

## 2022-01-17 DIAGNOSIS — F69 Unspecified disorder of adult personality and behavior: Secondary | ICD-10-CM | POA: Insufficient documentation

## 2022-01-17 DIAGNOSIS — F0390 Unspecified dementia without behavioral disturbance: Secondary | ICD-10-CM | POA: Diagnosis present

## 2022-01-17 LAB — CBC WITH DIFFERENTIAL/PLATELET
Abs Immature Granulocytes: 0.03 10*3/uL (ref 0.00–0.07)
Basophils Absolute: 0 10*3/uL (ref 0.0–0.1)
Basophils Relative: 0 %
Eosinophils Absolute: 0.2 10*3/uL (ref 0.0–0.5)
Eosinophils Relative: 2 %
HCT: 41.3 % (ref 39.0–52.0)
Hemoglobin: 13.5 g/dL (ref 13.0–17.0)
Immature Granulocytes: 0 %
Lymphocytes Relative: 19 %
Lymphs Abs: 1.7 10*3/uL (ref 0.7–4.0)
MCH: 30.6 pg (ref 26.0–34.0)
MCHC: 32.7 g/dL (ref 30.0–36.0)
MCV: 93.7 fL (ref 80.0–100.0)
Monocytes Absolute: 0.9 10*3/uL (ref 0.1–1.0)
Monocytes Relative: 11 %
Neutro Abs: 6 10*3/uL (ref 1.7–7.7)
Neutrophils Relative %: 68 %
Platelets: 210 10*3/uL (ref 150–400)
RBC: 4.41 MIL/uL (ref 4.22–5.81)
RDW: 14.9 % (ref 11.5–15.5)
WBC: 8.8 10*3/uL (ref 4.0–10.5)
nRBC: 0 % (ref 0.0–0.2)

## 2022-01-17 LAB — COMPREHENSIVE METABOLIC PANEL
ALT: 20 U/L (ref 0–44)
AST: 20 U/L (ref 15–41)
Albumin: 4.2 g/dL (ref 3.5–5.0)
Alkaline Phosphatase: 85 U/L (ref 38–126)
Anion gap: 13 (ref 5–15)
BUN: 24 mg/dL — ABNORMAL HIGH (ref 8–23)
CO2: 19 mmol/L — ABNORMAL LOW (ref 22–32)
Calcium: 9.2 mg/dL (ref 8.9–10.3)
Chloride: 107 mmol/L (ref 98–111)
Creatinine, Ser: 1.3 mg/dL — ABNORMAL HIGH (ref 0.61–1.24)
GFR, Estimated: 57 mL/min — ABNORMAL LOW (ref >60)
Glucose, Bld: 103 mg/dL — ABNORMAL HIGH (ref 70–99)
Potassium: 3.6 mmol/L (ref 3.5–5.1)
Sodium: 139 mmol/L (ref 135–145)
Total Bilirubin: 0.4 mg/dL (ref 0.3–1.2)
Total Protein: 6.6 g/dL (ref 6.5–8.1)

## 2022-01-17 LAB — URINALYSIS, ROUTINE W REFLEX MICROSCOPIC
Bilirubin Urine: NEGATIVE
Glucose, UA: NEGATIVE mg/dL
Hgb urine dipstick: NEGATIVE
Ketones, ur: NEGATIVE mg/dL
Leukocytes,Ua: NEGATIVE
Nitrite: NEGATIVE
Protein, ur: NEGATIVE mg/dL
Specific Gravity, Urine: 1.013 (ref 1.005–1.030)
pH: 5 (ref 5.0–8.0)

## 2022-01-17 NOTE — ED Notes (Signed)
Patient changed into purple scrubs. Things gathered and bagged. Cooperative. Wanded by security. ?
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
## 2022-01-17 NOTE — ED Notes (Signed)
Report given to facility. Patient changed back into clothing and given meal. [facility] arrives to pick up patient. Patient secured to stretcher x2 rails, straps x3. Pt seen leaving ED on [facility] stretcher without incident. ?

## 2022-01-17 NOTE — ED Notes (Signed)
[facility] called pt is number 6 on the list ?
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
## 2022-04-16 IMAGING — DX DG CHEST 2V
2 series · 2 of 2 positions shown · non-contrast
Comparison: None.

CLINICAL DATA: Preop clearance.

EXAM:
CHEST - 2 VIEW

[chest pa]
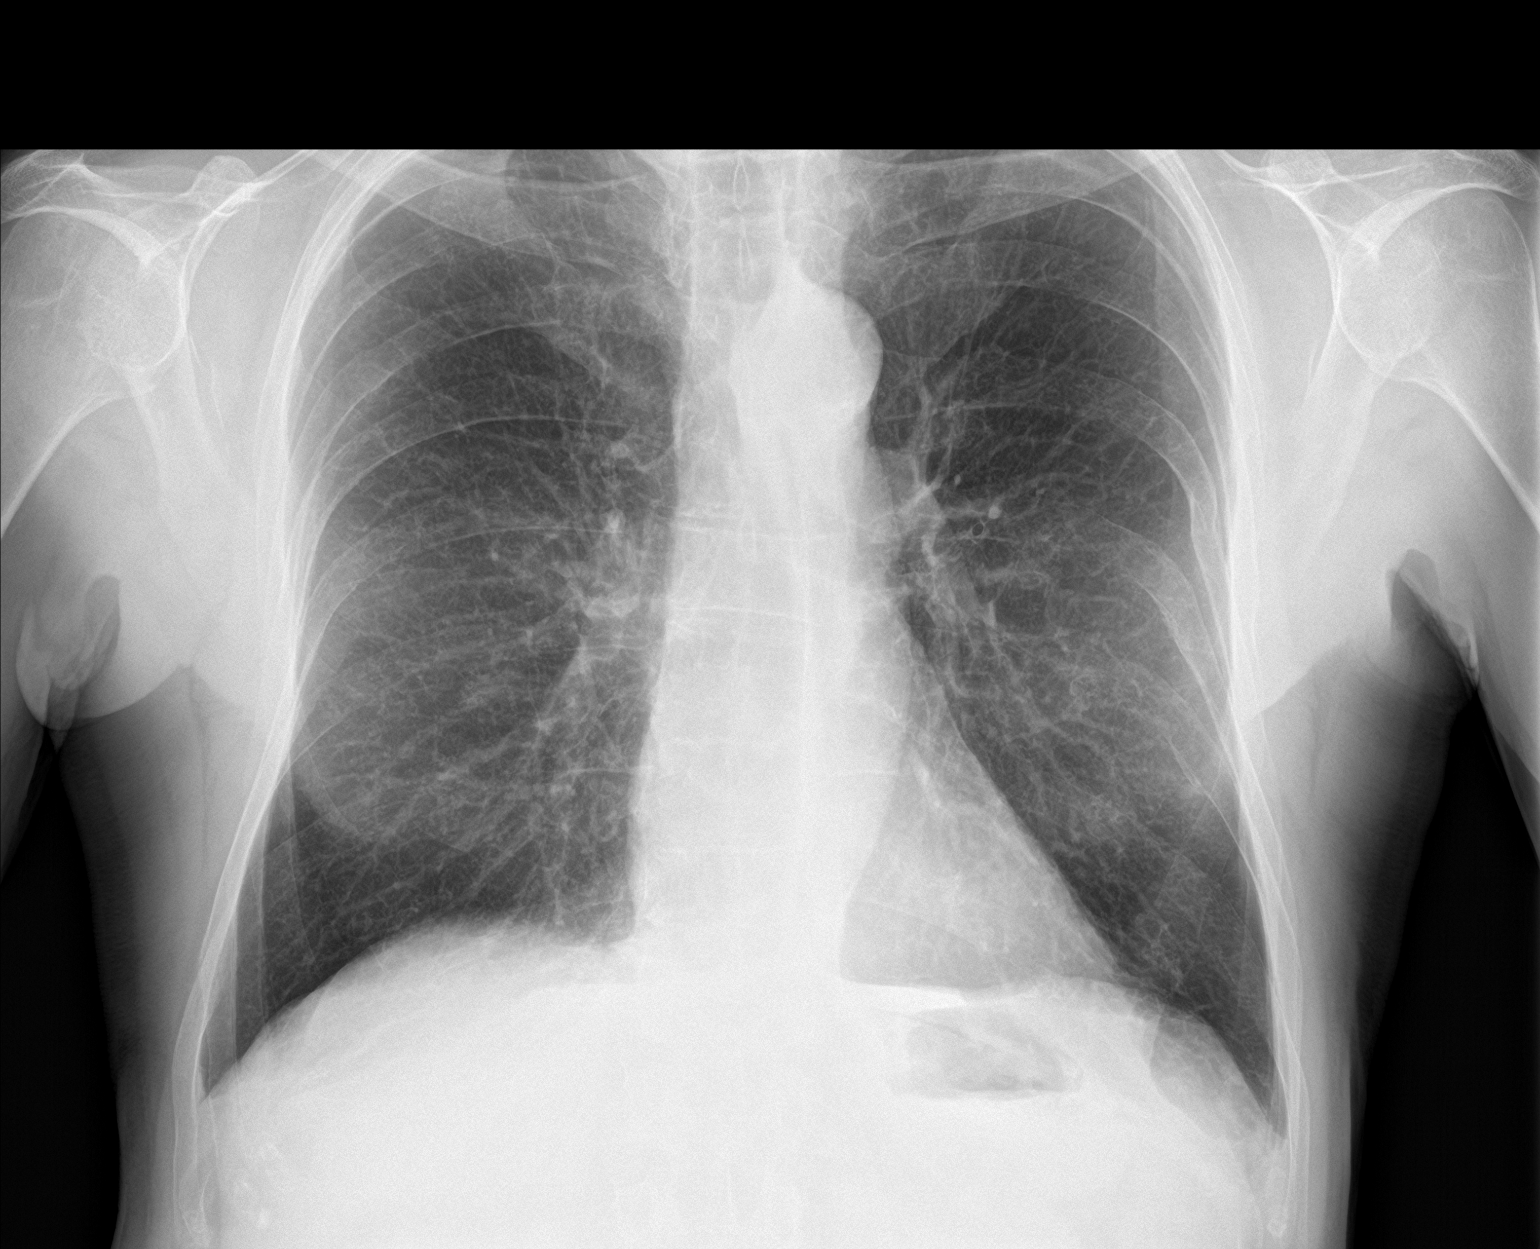

[chest lat]
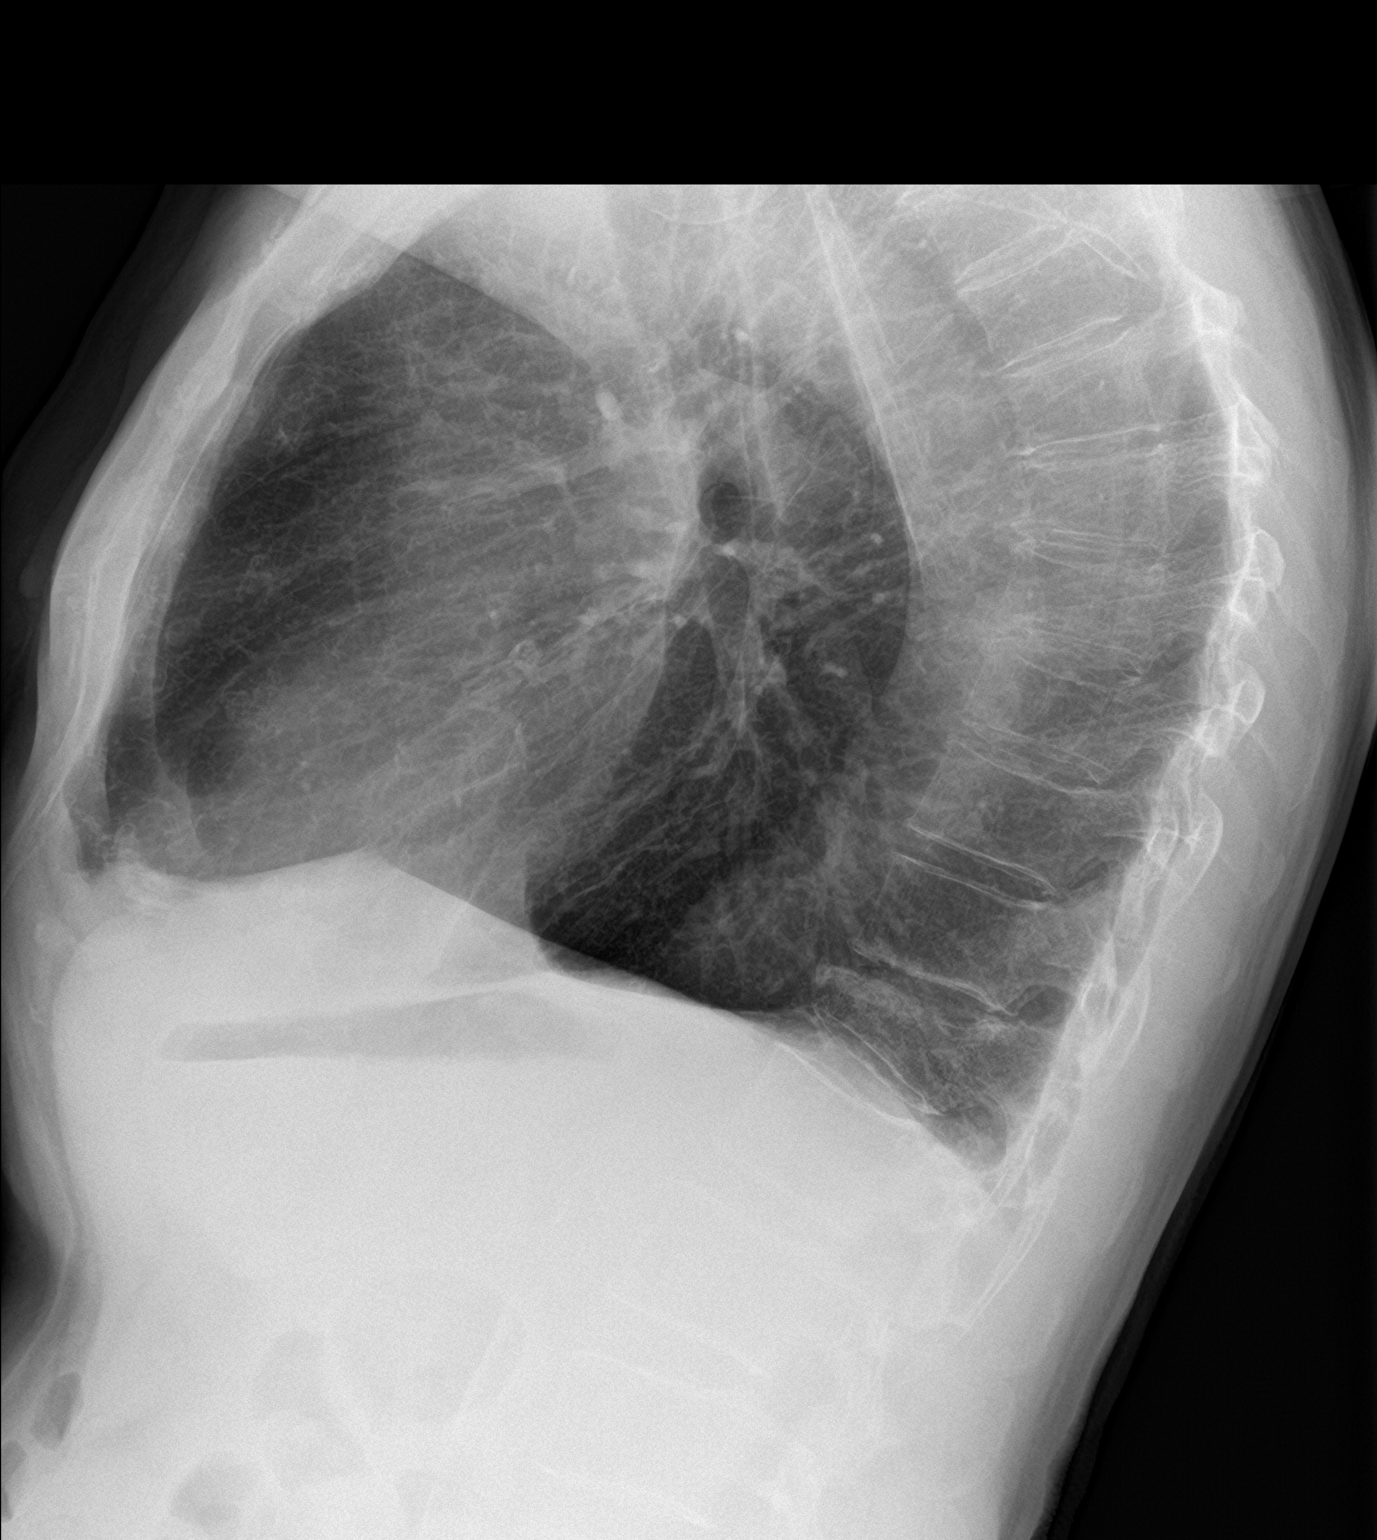

[2 of 2 positions shown; findings below may reference images not displayed]

FINDINGS: The heart size and mediastinal contours are within normal limits.
Both lungs are clear. Remote healed fracture deformities involving
the left seventh, eighth and ninth ribs. Mild upper thoracic spine
compression deformity is noted. Moderate compression fracture of the
lower thoracic spine is also noted. No acute fractures identified at
this time.
IMPRESSION: 1. No acute cardiopulmonary abnormalities.
2. Remote healed fracture deformities involving the left seventh,
eighth and ninth ribs.
3. Upper and lower thoracic spine compression deformities appear
remote.

## 2024-04-29 ENCOUNTER — Other Ambulatory Visit (HOSPITAL_COMMUNITY): Payer: Self-pay | Admitting: *Deleted

## 2024-04-29 DIAGNOSIS — R1310 Dysphagia, unspecified: Secondary | ICD-10-CM

## 2024-05-11 ENCOUNTER — Ambulatory Visit (HOSPITAL_COMMUNITY): Admission: RE | Admit: 2024-05-11 | Source: Ambulatory Visit

## 2024-05-11 ENCOUNTER — Ambulatory Visit (HOSPITAL_COMMUNITY)

## 2024-05-11 ENCOUNTER — Encounter (HOSPITAL_COMMUNITY): Payer: Self-pay

## 2024-06-10 ENCOUNTER — Telehealth (HOSPITAL_COMMUNITY): Payer: Self-pay | Admitting: Family Medicine

## 2024-06-10 NOTE — Telephone Encounter (Signed)
 Attempted to schedule swallow study (OP MBS) and left VM at [phone]  with request for call back to set up appointment. (AHARRIS)

## 2024-06-17 ENCOUNTER — Telehealth (HOSPITAL_COMMUNITY): Payer: Self-pay | Admitting: Family Medicine

## 2024-06-17 NOTE — Telephone Encounter (Signed)
 Attempted to contact pt to schedule OP MBS. Left VM at [phone] - FINAL ATTEMPT. Order will be canceled if no call back by 8/7 [facility])

## 2024-06-25 ENCOUNTER — Telehealth (HOSPITAL_COMMUNITY): Payer: Self-pay | Admitting: *Deleted

## 2024-06-25 NOTE — Telephone Encounter (Signed)
 Unable to contact patient to reschedule his OP MBS, will close order at this time. Patient will need a new order to schedule this test in the future. RKEEL

## 2024-10-20 ENCOUNTER — Emergency Department (HOSPITAL_COMMUNITY)

## 2024-10-20 ENCOUNTER — Encounter (HOSPITAL_COMMUNITY): Payer: Self-pay | Admitting: *Deleted

## 2024-10-20 ENCOUNTER — Emergency Department (HOSPITAL_COMMUNITY)
Admission: EM | Admit: 2024-10-20 | Discharge: 2024-10-20 | Disposition: A | Discharge status: Home / Self Care | Source: Skilled Nursing Facility | Attending: Emergency Medicine | Admitting: Emergency Medicine

## 2024-10-20 ENCOUNTER — Other Ambulatory Visit: Payer: Self-pay

## 2024-10-20 DIAGNOSIS — I10 Essential (primary) hypertension: Secondary | ICD-10-CM | POA: Insufficient documentation

## 2024-10-20 DIAGNOSIS — S0003XA Contusion of scalp, initial encounter: Secondary | ICD-10-CM | POA: Diagnosis present

## 2024-10-20 DIAGNOSIS — G9608 Other cranial cerebrospinal fluid leak: Secondary | ICD-10-CM | POA: Diagnosis not present

## 2024-10-20 DIAGNOSIS — L89151 Pressure ulcer of sacral region, stage 1: Secondary | ICD-10-CM | POA: Insufficient documentation

## 2024-10-20 DIAGNOSIS — W19XXXA Unspecified fall, initial encounter: Secondary | ICD-10-CM | POA: Diagnosis not present

## 2024-10-20 DIAGNOSIS — F0390 Unspecified dementia without behavioral disturbance: Secondary | ICD-10-CM | POA: Insufficient documentation

## 2024-10-20 DIAGNOSIS — Z79899 Other long term (current) drug therapy: Secondary | ICD-10-CM | POA: Diagnosis not present

## 2024-10-20 LAB — CBC WITH DIFFERENTIAL/PLATELET
Abs Immature Granulocytes: 0.04 K/uL (ref 0.00–0.07)
Basophils Absolute: 0 K/uL (ref 0.0–0.1)
Basophils Relative: 0 %
Eosinophils Absolute: 0.1 K/uL (ref 0.0–0.5)
Eosinophils Relative: 1 %
HCT: 36.7 % — ABNORMAL LOW (ref 39.0–52.0)
Hemoglobin: 11.7 g/dL — ABNORMAL LOW (ref 13.0–17.0)
Immature Granulocytes: 0 %
Lymphocytes Relative: 9 %
Lymphs Abs: 1.2 K/uL (ref 0.7–4.0)
MCH: 26.7 pg (ref 26.0–34.0)
MCHC: 31.9 g/dL (ref 30.0–36.0)
MCV: 83.8 fL (ref 80.0–100.0)
Monocytes Absolute: 0.9 K/uL (ref 0.1–1.0)
Monocytes Relative: 7 %
Neutro Abs: 10.7 K/uL — ABNORMAL HIGH (ref 1.7–7.7)
Neutrophils Relative %: 83 %
Platelets: 312 K/uL (ref 150–400)
RBC: 4.38 MIL/uL (ref 4.22–5.81)
RDW: 15.2 % (ref 11.5–15.5)
WBC: 13 K/uL — ABNORMAL HIGH (ref 4.0–10.5)
nRBC: 0 % (ref 0.0–0.2)

## 2024-10-20 LAB — COMPREHENSIVE METABOLIC PANEL WITH GFR
ALT: 23 U/L (ref 0–44)
AST: 35 U/L (ref 15–41)
Albumin: 3.8 g/dL (ref 3.5–5.0)
Alkaline Phosphatase: 95 U/L (ref 38–126)
Anion gap: 13 (ref 5–15)
BUN: 38 mg/dL — ABNORMAL HIGH (ref 8–23)
CO2: 27 mmol/L (ref 22–32)
Calcium: 9.3 mg/dL (ref 8.9–10.3)
Chloride: 102 mmol/L (ref 98–111)
Creatinine, Ser: 1.1 mg/dL (ref 0.61–1.24)
GFR, Estimated: >60 mL/min (ref >60)
Glucose, Bld: 103 mg/dL — ABNORMAL HIGH (ref 70–99)
Potassium: 3.4 mmol/L — ABNORMAL LOW (ref 3.5–5.1)
Sodium: 142 mmol/L (ref 135–145)
Total Bilirubin: 0.4 mg/dL (ref 0.0–1.2)
Total Protein: 7.1 g/dL (ref 6.5–8.1)

## 2024-10-20 MED ORDER — SODIUM CHLORIDE 0.9 % IV BOLUS
1000.0000 mL | Freq: Once | INTRAVENOUS | Status: AC
  Administered 2024-10-20: 1000 mL via INTRAVENOUS

## 2024-10-20 NOTE — ED Notes (Signed)
 Bed alarm placed, warm blanket given and patient adjusted in bed. Patient resting at this time.

## 2024-10-20 NOTE — ED Notes (Signed)
Called [facility] for transport.  

## 2024-10-20 NOTE — ED Provider Notes (Signed)
 [place] EMERGENCY DEPARTMENT AT [facility] Provider Note   CSN: [id number] Arrival date & time: 10/20/24  8162     Patient presents with: Carl Obrien   Eldwin Volkov is a 78 y.o. male history of hypertension, dementia here presenting with fall.  Patient is from [facility].  Patient apparently had an unwitnessed fall.  Patient does fall frequently.  Patient cannot tell me how he fell.  He was noted to be hypotensive with blood pressure 82/52 initially.  Patient's arrival blood pressure was 135/77.   The history is provided by the patient.       Prior to Admission medications   Medication Sig Start Date End Date Taking? Authorizing Provider  coal tar (SM ANTI-DANDRUFF COAL TAR) 0.5 % shampoo Apply topically at bedtime as needed. 04/02/21   Katrinka Garnette KIDD, MD  acetaminophen  (TYLENOL ) 500 MG tablet Take 500 mg by mouth at bedtime.    [provider]  albuterol (VENTOLIN HFA) 108 (90 Base) MCG/ACT inhaler Inhale 2 puffs into the lungs 2 (two) times daily as needed for wheezing or shortness of breath.  Patient not taking: Reported on 02/21/2021    [provider]  allopurinol (ZYLOPRIM) 100 MG tablet Take 100 mg by mouth daily. (0900)    [provider]  atorvastatin (LIPITOR) 40 MG tablet Take 40 mg by mouth at bedtime. (2000)    [provider]  azelastine  (ASTELIN ) 0.1 % nasal spray Place 1 spray into both nostrils 2 (two) times daily. Use in each nostril as directed 11/20/20   Gladis Elsie BROCKS, PA-C  cloNIDine (CATAPRES) 0.1 MG tablet Take 0.1 mg by mouth every 8 (eight) hours as needed (hypertension.).     [provider]  coal tar (NEUTROGENA T-GEL) 0.5 % shampoo Apply topically every other day. 04/02/21   Kennyth Worth HERO, MD  donepezil (ARICEPT) 5 MG tablet Take 5 mg by mouth at bedtime. (2000)    [provider]  fluticasone (FLONASE) 50 MCG/ACT nasal spray Place 1 spray into both nostrils 2 (two) times daily. (0900 &  1600) 01/21/20   [provider]  hydrochlorothiazide (HYDRODIURIL) 25 MG tablet Take 25 mg by mouth daily. (0900)    [provider]  latanoprost (XALATAN) 0.005 % ophthalmic solution Place 1 drop into both eyes at bedtime.  01/21/20   [provider]  loratadine (CLARITIN) 10 MG tablet Take 10 mg by mouth daily.    [provider]  losartan (COZAAR) 50 MG tablet Take 50 mg by mouth 2 (two) times daily. (0900 & 1600)    [provider]  montelukast (SINGULAIR) 10 MG tablet Take 10 mg by mouth daily. (0900)    [provider]  oxyCODONE  (OXY IR/ROXICODONE ) 5 MG immediate release tablet Take 5 mg by mouth in the morning and at bedtime.    [provider]  oxyCODONE -acetaminophen  (PERCOCET/ROXICET) 5-325 MG tablet Take 1 tablet by mouth every 6 (six) hours as needed for severe pain.    [provider]  pantoprazole (PROTONIX) 40 MG tablet Take 40 mg by mouth daily at 6 (six) AM. (0600)    [provider]  sertraline (ZOLOFT) 25 MG tablet Take 25 mg by mouth at bedtime. (2000)    [provider]  Sunscreens (COPPERTONE SPORT SPF 30 EX) Apply 1 application topically every 2 (two) hours as needed (while in the sun).    [provider]  thiamine 100 MG tablet Take 100 mg by mouth daily. (0900)  [provider]  timolol (BETIMOL) 0.5 % ophthalmic solution Place 1 drop into both eyes 2 (two) times daily. (0900 & 1600)    [provider]  triamcinolone  (KENALOG ) 0.1 % Apply 1 application topically daily.    [provider]  vitamin B-12 (CYANOCOBALAMIN) 1000 MCG tablet Take 1,000 mcg by mouth daily. (0900)    [provider]  zolpidem  (AMBIEN ) 5 MG tablet Take 1 tablet (5 mg total) by mouth at bedtime. 05/23/21 08/21/21  Allwardt, Alyssa M, PA-C    Allergies: Ciprofloxacin    Review of Systems  Psychiatric/Behavioral:  Positive for confusion.   All other systems reviewed and  are negative.   Updated Vital Signs BP 135/77   Pulse 79   Temp 98.4 F (36.9 C) (Oral)   Resp 14   Wt 66.7 kg   SpO2 98%   BMI 22.35 kg/m   Physical Exam Vitals and nursing note reviewed.  Constitutional:      Comments: Chronically ill and demented  HENT:     Head: Normocephalic.     Comments: Small posterior scalp hematoma but no obvious laceration    Nose: Nose normal.     Mouth/Throat:     Mouth: Mucous membranes are moist.  Eyes:     Extraocular Movements: Extraocular movements intact.     Pupils: Pupils are equal, round, and reactive to light.  Neck:     Comments: C-collar in place. Cardiovascular:     Rate and Rhythm: Normal rate and regular rhythm.     Pulses: Normal pulses.     Heart sounds: Normal heart sounds.  Pulmonary:     Effort: Pulmonary effort is normal.     Breath sounds: Normal breath sounds.  Abdominal:     General: Abdomen is flat.     Palpations: Abdomen is soft.  Musculoskeletal:     Comments: Stage I sacral decub ulcer.  No obvious extremity trauma  Skin:    General: Skin is warm.     Capillary Refill: Capillary refill takes less than 2 seconds.  Neurological:     Comments: Demented and moving all extremities  Psychiatric:        Mood and Affect: Mood normal.        Behavior: Behavior normal.     (all labs ordered are listed, but only abnormal results are displayed) Labs Reviewed  CBC WITH DIFFERENTIAL/PLATELET  COMPREHENSIVE METABOLIC PANEL WITH GFR    EKG: EKG Interpretation Date/Time:  Wednesday October 20 2024 18:54:44 EST Ventricular Rate:  86 PR Interval:  137 QRS Duration:  98 QT Interval:  358 QTC Calculation: 429 R Axis:   88  Text Interpretation: Sinus rhythm Borderline right axis deviation No significant change since last tracing Confirmed by Patt Alm DEL [phone]) on 10/20/2024 6:58:46 PM  Radiology: No results found.   Procedures   Medications Ordered in the ED  sodium chloride  0.9 % bolus 1,000 mL (has  no administration in time range)                                    Medical Decision Making Carl Obrien is a 78 y.o. male here presenting with unwitnessed fall.  Patient is demented unable to give me history.  Patient was apparently hypotensive prior to arrival.  Consider dehydration versus electrolyte abnormality versus intracranial bleeding.  Plan to get CBC CMP and CT head and cervical spine and hydrate  and reassess  10:17 PM Reviewed patient's labs and they were unremarkable.  CT head and cervical spine unremarkable and x-rays unremarkable.  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 At this point patient is stable for discharge  Problems Addressed: Fall, initial encounter: acute illness or injury  Amount and/or Complexity of Data Reviewed Labs: ordered. Decision-making details documented in ED Course. Radiology: ordered. ECG/medicine tests: ordered.     Final diagnoses:  None    ED Discharge Orders     None          Patt Alm Macho, MD 10/20/24 2218

## 2024-10-20 NOTE — ED Notes (Signed)
 Called and gave update to patients brother about patients status and discharge back to facility.

## 2024-10-20 NOTE — ED Notes (Signed)
 Patient transported to CT

## 2024-10-20 NOTE — Discharge Instructions (Signed)
 Your CT scan showed subdural hygroma which is unchanged from previous  Fall prevention at the facility  See your doctor for follow-up  Return to ER if you have another fall or vomiting or altered mental status

## 2024-10-20 NOTE — ED Notes (Signed)
 Called gave report to Rashidah at [facility].

## 2024-10-20 NOTE — ED Triage Notes (Signed)
 BIB [facility] from [facility] s/p unwitnessed fall, unknown LOC, unknown prodrome, no blood thinners. Frequent falls. No obvious wounds, injuries, deformities, bruising, pains or abrasions. C-collar applied for precaution. NSL 20g L AC. Reports decreased mentation, PO intake and activity over last week. No obvious illness, fever, NVD. MAEx4. Initial BP82/52, but improved to 102/64 on its own. No meds, IVF or tx given. VSS. CBG 195. NSR on monitor. Skin W&D, resps e/u, arousable to voice and follows commands.

## 2024-12-19 DEATH — deceased
